# Patient Record
Sex: Female | Born: 1965 | Race: White | Hispanic: No | Marital: Married | Smoking: Current every day smoker
Health system: Southern US, Community
[De-identification: ages and names within clinical notes are randomized; demographics above are authoritative.]

## PROBLEM LIST (undated history)

## (undated) DIAGNOSIS — G8929 Other chronic pain: Secondary | ICD-10-CM

## (undated) DIAGNOSIS — D509 Iron deficiency anemia, unspecified: Secondary | ICD-10-CM

## (undated) DIAGNOSIS — Z8669 Personal history of other diseases of the nervous system and sense organs: Secondary | ICD-10-CM

## (undated) DIAGNOSIS — R51 Headache: Secondary | ICD-10-CM

## (undated) DIAGNOSIS — M2550 Pain in unspecified joint: Secondary | ICD-10-CM

## (undated) DIAGNOSIS — R519 Headache, unspecified: Secondary | ICD-10-CM

## (undated) DIAGNOSIS — K219 Gastro-esophageal reflux disease without esophagitis: Secondary | ICD-10-CM

## (undated) HISTORY — DX: Other chronic pain: G89.29

## (undated) HISTORY — DX: Headache, unspecified: R51.9

## (undated) HISTORY — DX: Headache: R51

## (undated) HISTORY — DX: Iron deficiency anemia, unspecified: D50.9

## (undated) HISTORY — PX: ABDOMINAL HYSTERECTOMY: SHX81

## (undated) HISTORY — DX: Personal history of other diseases of the nervous system and sense organs: Z86.69

## (undated) HISTORY — PX: TUBAL LIGATION: SHX77

## (undated) HISTORY — DX: Pain in unspecified joint: M25.50

---

## 2001-06-08 ENCOUNTER — Emergency Department (HOSPITAL_COMMUNITY): Admission: EM | Admit: 2001-06-08 | Discharge: 2001-06-08 | Payer: Self-pay | Admitting: *Deleted

## 2001-06-10 ENCOUNTER — Encounter: Payer: Self-pay | Admitting: *Deleted

## 2001-06-10 ENCOUNTER — Ambulatory Visit (HOSPITAL_COMMUNITY): Admission: RE | Admit: 2001-06-10 | Discharge: 2001-06-10 | Payer: Self-pay | Admitting: *Deleted

## 2001-07-06 ENCOUNTER — Encounter: Payer: Self-pay | Admitting: Emergency Medicine

## 2001-07-07 ENCOUNTER — Inpatient Hospital Stay (HOSPITAL_COMMUNITY): Admission: EM | Admit: 2001-07-07 | Discharge: 2001-07-10 | Payer: Self-pay | Admitting: Emergency Medicine

## 2001-08-06 ENCOUNTER — Ambulatory Visit (HOSPITAL_COMMUNITY): Admission: RE | Admit: 2001-08-06 | Discharge: 2001-08-06 | Payer: Self-pay | Admitting: Urology

## 2001-08-06 ENCOUNTER — Encounter: Payer: Self-pay | Admitting: Urology

## 2002-03-19 DIAGNOSIS — D509 Iron deficiency anemia, unspecified: Secondary | ICD-10-CM

## 2002-03-19 HISTORY — DX: Iron deficiency anemia, unspecified: D50.9

## 2002-11-09 ENCOUNTER — Ambulatory Visit (HOSPITAL_COMMUNITY): Admission: RE | Admit: 2002-11-09 | Discharge: 2002-11-09 | Payer: Self-pay | Admitting: Family Medicine

## 2002-11-09 ENCOUNTER — Encounter: Payer: Self-pay | Admitting: Family Medicine

## 2002-12-14 ENCOUNTER — Encounter: Payer: Self-pay | Admitting: Obstetrics and Gynecology

## 2002-12-14 ENCOUNTER — Ambulatory Visit (HOSPITAL_COMMUNITY): Admission: RE | Admit: 2002-12-14 | Discharge: 2002-12-14 | Payer: Self-pay | Admitting: Obstetrics and Gynecology

## 2003-05-17 ENCOUNTER — Inpatient Hospital Stay (HOSPITAL_COMMUNITY): Admission: RE | Admit: 2003-05-17 | Discharge: 2003-05-18 | Payer: Self-pay | Admitting: Obstetrics and Gynecology

## 2008-03-24 ENCOUNTER — Ambulatory Visit (HOSPITAL_COMMUNITY): Admission: RE | Admit: 2008-03-24 | Discharge: 2008-03-24 | Payer: Self-pay | Admitting: Family Medicine

## 2010-02-17 ENCOUNTER — Ambulatory Visit (HOSPITAL_COMMUNITY): Admission: RE | Admit: 2010-02-17 | Discharge: 2010-02-17 | Payer: Self-pay | Admitting: Family Medicine

## 2010-08-04 NOTE — Discharge Summary (Signed)
[facility]  Patient:    Suzanne Hart, Suzanne Hart Visit Number: [id number] MRN: [id number]          Service Type: MED Location: 3A G401 01 Attending Physician:  Alleen Borne I Dictated by:   Alleen Borne, M.D. Admit Date:  07/06/2001 Discharge Date: 07/10/2001                             Discharge Summary  HISTORY OF PRESENT ILLNESS:  This 45 year old female was riding her horse and, all of a sudden, horse jumped and the low stomach hit the saddle and started to have severe pain, brought into the emergency room where she was found to have gross hematuria and CT scan was done which showed there was fluid around the bladder.  A cystogram was performed and it showed extraperitoneal rupture of the bladder.  The gross bleeding through the Foley catheter almost subsided, so it was decided that patient should be admitted and observed on a bed rest.  HOSPITAL COURSE:  WBC count on admission was hemoglobin 11.6 and hematocrit 35.1.  Sodium 133, potassium 3.2, chloride 103, CO2 24, glucose 105, BUN 7, creatinine 0.7.  She was started on bed rest, IV fluids, and Foley catheter drainage.  Slowly, the gross bleeding disappeared.  The next day, her hematocrit was 32.1.  Still, her vital signs were stable, she was comfortable. Hematocrit on 4/22 was 30.6.  Her blood pressure was running low around 100 systolic, but she says that she runs low blood pressure.  She was completely asymptomatic otherwise.  She was still having some suprapubic discomfort on walking, urine grossly clear, and on 4/23, hematocrit was 29.8.  Her pulse was always stable at between 60 and 70.  So, that was the reason I did not give her any blood transfusion.  She was stable with this hematocrit.  On 4/24, her blood pressure was 111/80, pulse 70 per minute.  She was feeling much better. She was up and walking around, eating regular food.  So, I am going to discharge her home with a Foley catheter.   It should be mentioned also that she did not have any fracture of the pelvis.  So, at this point, she is being discharged home.  FINAL DISCHARGE DIAGNOSIS:  Extraperitoneal bladder rupture.  DISCHARGE CONDITION:  Improved.  DISCHARGE MEDICATIONS:  1. Cipro XR 250 mg one a day, #5.  2. Toradol 10 mg p.o. q.6h. p.r.n., #30.  3. Ferrous sulfate 325 mg one p.o. daily, #30.  FOLLOW-UP:  Report to the office in one week. Dictated by:   Alleen Borne, M.D. Attending Physician:  Alleen Borne I DD:  08/02/01 TD:  08/05/01 Job: [id number]

## 2010-08-04 NOTE — Procedures (Signed)
   NAME:  Suzanne Hart, Suzanne Hart                            ACCOUNT NO.:  [id number]   MEDICAL RECORD NO.:  [id number]                   PATIENT TYPE:  OUT   LOCATION:  RAD                                  FACILITY:  [facility]   PHYSICIAN:  Vida Roller, M.D.                DATE OF BIRTH:  1965/07/28   DATE OF PROCEDURE:  11/09/2002  DATE OF DISCHARGE:                                  ECHOCARDIOGRAM   TAPE NUMBER:  [id number].   TAPE COUNT:  [id number] - [id number].   INDICATIONS FOR PROCEDURE:  This is a 45 year old woman with chest pain and  peripheral edema.   TECHNICAL QUALITY:  Technical quality of the study was adequate.   M-MODE TRACINGS:  1. The aorta is 26 mm.  2. The left atrium is 42 mm.  3. The septum is 11 mm.  4. The posterior wall is 8 mm.  5. The left ventricular diastolic dimension is 43 mm.  6. The left ventricular systolic dimension is 31 mm.   2-D AND DOPPLER IMAGING:  1. The left ventricle is normal size with normal systolic and diastolic     function. There are no wall motion abnormalities seen.  2. The right ventricle is normal size with normal systolic function. No wall     motion abnormalities are seen.  3. Both atria are top normal size with the left atrium being just slightly     enlarged. There is no obvious atrial septal defect.  4. The aortic valve is tri-leaflet, tri-commissural, with no evidence of     stenosis or regurgitation.  5. The mitral valve is morphologically unremarkable with trivial     insufficiency. No stenosis is seen.  6. The tricuspid valve is morphologically unremarkable with no stenosis or     regurgitation.  7. The pulmonary valve is not well seen but appears to have trivial     insufficiency.  8. The pericardial structures are normal.  9. The inferior vena cava is normal size.  10.      The ascending aorta appears to be normal.                                               Vida Roller, M.D.    JH/MEDQ  D:  11/09/2002  T:  11/09/2002  Job:   [id number]

## 2010-08-04 NOTE — Op Note (Signed)
NAME:  Suzanne Hart, Suzanne Hart                            ACCOUNT NO.:  [id number]   MEDICAL RECORD NO.:  [id number]                   PATIENT TYPE:  INP   LOCATION:  A409                                 FACILITY:  [facility]   PHYSICIAN:  Tilda Burrow, M.D.              DATE OF BIRTH:  [birth year]   DATE OF PROCEDURE:  DATE OF DISCHARGE:                                 OPERATIVE REPORT   PREOPERATIVE DIAGNOSIS:  Uterine fibroids, pelvic pain secondary to  fibroids, enlargement of the uterus.   POSTOPERATIVE DIAGNOSES:  1. Uterine fibroids, pelvic pain secondary to fibroids, enlargement of the     uterus.  2. Fecalith of the appendix.   PROCEDURE:  1. Total abdominal hysterectomy.  2. Panniculectomy.  3. Incidental appendectomy.   SURGEON:  Tilda Burrow, M.D.   ASSISTANTMarlinda Mike, RN-FA   ANESTHESIA:  General.   COMPLICATIONS:  None.   FINDINGS:  1000 cc plus uterine size.  Protuberant abdomen due to underlying  uterine enlargement.  Evidence of prior, transverse, lower abdominal  incision.  Elongate anterior appendix with fecalith in its distal portion.   DETAILS OF PROCEDURE:  The patient was taken to the operating room and  prepped and draped for lower abdominal surgery with Foley catheter in place  and abdominal and vaginal prepping performed.  A transverse incision was  performed excising from the old suprapubic transverse incision, removing an  ellipse of skin 30 cm in length x 10 cm in maximum diameter; dissecting and  removing an approximately 3-cm thick flap of underlying fatty tissue being  careful to leave a layer of fatty tissue over the fascia which was not left  and denuded.  We then opened the abdomen in the midline between the rectus  muscles.  There were some adhesions encountered there from her prior  laparotomy.  The peritoneum was easily entered and no abdominal wall  adhesions encountered.  There was no suspicion of intra-abdominal trauma  related to entry.   The bowel was not visible, but due to the huge size of  the fibroid uterus the midline incision extended almost to the level of the  umbilicus, without placing a vertical component to the skin incision (Pelosi  technique).   The round ligaments could be identified by rotating the uterus away from the  involved side.  The round ligaments were then taken down in series;  clamping, cutting and suture ligating both fore-and-aft.  The utero-ovarian  ligament on the left side was easily isolated.  The broad ligament  penetrated and the utero-ovarian ligament doubly clamped, cut, and suture  ligated.  The other adnexa was treated similarly.  We then proceeded to  skeletonize the bladder flap anteriorly and dissect down the sides of the  uterus on either side. The huge uterus was quite mobile with minimal  adhesions.  We then were able to elevate the uterus through  the incision,  place Balfour retractor and bladder flap in place; and then proceed with  doubly cross-clamping the uterine vessels on the right side, placing a Kelly  clamp to control backbleeding, transecting the uterine vessels and then  doubly ligating the uterine vessels on that side.   The opposite site was treated similarly.  Straight Heaney clamp was then  used to clamp down the upper portions of the cardinal ligaments with  clamping, cutting, and suture ligating with #0 chromic.  At this point the  huge uterine fundus was amputated off the lower uterine segment and then  Lahey thyroid tenaculum was used to grasp the lower uterine segment stump.  We then marched down each side of the lower uterine segment clamping,  cutting, and suture ligating.  When we reached the level of the anterior  cervical vaginal fornix Bovie cautery was used to enter the anterior  cervical vaginal fornix without difficulty.  This was easily identified by  placing a right-angle clamp through the cervical os into the cervical  vaginal fornix for proper  orientation.   We then amputated the cervix off of the underlying cuff and then closed the  cuff with Aldrich stitches at each lateral vaginal angle, interrupted 2-0  chromic on each side x1 enclosing the remainder of the cuff in the midline.  Hemostasis was quite good.  The right tubo-ovarian complex was inspected and  pedicle required resuturing of the utero-ovarian ligament to improve  hemostasis.   We then inspected the appendix on this side and it was quite elongate, stuck  to the IP ligament on this side; and isolated, elevated, and palpated where  a large number of fecalith portions were noted in its most distal portions.  We decided to remove the appendix which was done by separating the  mesoappendix and the four pedicles, transecting the mesoappendix in series,  ligating each of the underlying pedicles and then cross-clamping the  appendix at the cecum, removing the appendix and doubly ligating the stump  with imbrication by the second ligation.   The pelvis was irrigated copiously; the bowel repositioned; the anterior  peritoneum closed with 2-0 chromic in running fashion.  The fascia closed  with #0 Maxon in a continuous running fashion with the subcu tissues  irrigated, confirmed as hemostatic, reapproximated using interrupted 2-0  plain with flat JP drain placed deep in the incision; and stable closure of  the skin used to complete the procedure.  The patient tolerated the  procedure well and went to recovery room in good condition with estimated  blood loss of 300 cc.      ___________________________________________                                            Tilda Burrow, M.D.   JVF/MEDQ  D:  05/17/2003  T:  05/18/2003  Job:  [id number]   cc:   Tilda Burrow, M.D.  [address] [place]  [place] [address]  Fax: [phone]

## 2010-08-04 NOTE — H&P (Signed)
[facility]  Patient:    Suzanne Hart, Suzanne Hart Visit Number: [id number] MRN: [id number]          Service Type: EMS Location: ED Attending Physician:  Annamarie Dawley Dictated by:   Alleen Borne, M.D. Admit Date:  07/06/2001                           History and Physical  CHIEF COMPLAINT:  Lower abdominal pain.  HISTORY OF PRESENT ILLNESS:  This is a 45 year old female who this afternoon was riding her horse and all of a sudden the horse started to buck and she hit her lower abdomen against the saddle and started to have severe pain, so she was brought by ambulance to the emergency room, where a CT scan of the abdomen was done.  It showed that she has fluid around her bladder, although no dye was seen in the fluid because the bladder was not distended.  Anyway, a cystogram was done and showed extraperitoneal rupture of the bladder and no other injury like pelvic fracture, etc.  The patient is having gross hematuria so we have inserted a Foley catheter after discussing with the patient and the husband.  I decided that she would be observed.  It is extraperitoneal, there is a chance it will heal over though open closure of the bladder.  It is possible she will continue to have more bleeding so I am going to check her hematocrit.  There may be another reason but if the bleeding does not stop I will have to go in and close the laceration.  At this point the patient is stable.  PAST MEDICAL HISTORY:  She does not have any other medical problem except fibroid uterus.  No history of diabetes, hypertension.  SOCIAL HISTORY:  She does smoke, and drinks occasionally.  REVIEW OF SYSTEMS:  Otherwise unremarkable.  PHYSICAL EXAMINATION:  GENERAL:  Fully conscious, alert and oriented.  VITAL SIGNS:  Blood pressure 120/80, pulse 66 per minute, temperature 98 degrees.  CENTRAL NERVOUS SYSTEM:  No gross neurologic deficit.  HEENT:  Negative.  NECK:  Supple.   No lymphadenopathy.  CHEST:  Symmetrical.  HEART:  Regular sinus rhythm.  ABDOMEN:  Soft, nondistended.  There is marked tenderness in the suprapubic area.  PELVIC:  Examination deferred.  Foley catheter is draining pinkish urine.  IMPRESSION:  Extraperitoneal rupture of the bladder.  PLAN:  1. IV fluid.  2. Observation.  3. Check hematocrit again in the morning. Dictated by:   Alleen Borne, M.D. Attending Physician:  Annamarie Dawley DD:  07/07/01 TD:  07/07/01 Job: [id number]
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
## 2012-09-22 ENCOUNTER — Ambulatory Visit (INDEPENDENT_AMBULATORY_CARE_PROVIDER_SITE_OTHER): Payer: BC Managed Care – PPO | Admitting: Family Medicine

## 2012-09-22 ENCOUNTER — Encounter: Payer: Self-pay | Admitting: Family Medicine

## 2012-09-22 VITALS — BP 118/74 | HR 70 | Ht 65.0 in | Wt 190.0 lb

## 2012-09-22 DIAGNOSIS — M549 Dorsalgia, unspecified: Secondary | ICD-10-CM

## 2012-09-22 MED ORDER — CHLORZOXAZONE 500 MG PO TABS: 500.0000 mg | ORAL_TABLET | Freq: Four times a day (QID) | ORAL | Status: DC | PRN

## 2012-09-22 MED ORDER — ETODOLAC 400 MG PO TABS: 400.0000 mg | ORAL_TABLET | Freq: Two times a day (BID) | ORAL | Status: DC

## 2012-09-22 NOTE — Patient Instructions (Signed)
Back Exercises Back exercises help treat and prevent back injuries. The goal of back exercises is to increase the strength of your abdominal and back muscles and the flexibility of your back. These exercises should be started when you no longer have back pain. Back exercises include:  Pelvic Tilt. Lie on your back with your knees bent. Tilt your pelvis until the lower part of your back is against the floor. Hold this position 5 to 10 sec and repeat 5 to 10 times.  Knee to Chest. Pull first 1 knee up against your chest and hold for 20 to 30 seconds, repeat this with the other knee, and then both knees. This may be done with the other leg straight or bent, whichever feels better.  Sit-Ups or Curl-Ups. Bend your knees 90 degrees. Start with tilting your pelvis, and do a partial, slow sit-up, lifting your trunk only 30 to 45 degrees off the floor. Take at least 2 to 3 seconds for each sit-up. Do not do sit-ups with your knees out straight. If partial sit-ups are difficult, simply do the above but with only tightening your abdominal muscles and holding it as directed.  Hip-Lift. Lie on your back with your knees flexed 90 degrees. Push down with your feet and shoulders as you raise your hips a couple inches off the floor; hold for 10 seconds, repeat 5 to 10 times.  Back arches. Lie on your stomach, propping yourself up on bent elbows. Slowly press on your hands, causing an arch in your low back. Repeat 3 to 5 times. Any initial stiffness and discomfort should lessen with repetition over time.  Shoulder-Lifts. Lie face down with arms beside your body. Keep hips and torso pressed to floor as you slowly lift your head and shoulders off the floor. Do not overdo your exercises, especially in the beginning. Exercises may cause you some mild back discomfort which lasts for a few minutes; however, if the pain is more severe, or lasts for more than 15 minutes, do not continue exercises until you see your caregiver.  Improvement with exercise therapy for back problems is slow.  See your caregivers for assistance with developing a proper back exercise program. Document Released: 04/12/2004 Document Revised: 05/28/2011 Document Reviewed: 01/04/2011 [employer] Patient Information 2014 [employer].  

## 2012-09-22 NOTE — Progress Notes (Signed)
  Subjective:    Patient ID: Suzanne Hart, female    DOB: [birth year], 47 y.o.   MRN: [id number]  Back Pain This is a new problem. The current episode started yesterday. The problem occurs constantly. The pain is at a severity of 8/10. The pain is severe. The pain is worse during the day. The symptoms are aggravated by bending and lying down. Associated symptoms include leg pain. Pertinent negatives include no headaches. Risk factors include lack of exercise. She has tried analgesics and NSAIDs for the symptoms. The treatment provided moderate relief.   Of note a sudden popping sensation when reaching out. Pain primarily left lumbar region. Some radiation across the back. Terry tight at times. No major radiation into left leg. No numbness or tingling.   Review of Systems  Musculoskeletal: Positive for back pain.  Neurological: Negative for headaches.   ROS otherwise negative     Objective:   Physical Exam Alert no acute distress. Lungs clear. Heart regular rate and rhythm. Positive left. Lumbar tenderness. Negative straight leg raise. Negative sciatic notch tenderness.       Assessment & Plan:  Impression lumbar strain discussed. Plan Lodine twice a day with food. Chlorzoxazone 3 times a day. Local measures discussed. Exercise after this columns down. WSL

## 2012-09-23 ENCOUNTER — Encounter: Payer: Self-pay | Admitting: *Deleted

## 2012-11-24 ENCOUNTER — Encounter: Payer: Self-pay | Admitting: Family Medicine

## 2012-11-24 ENCOUNTER — Ambulatory Visit (INDEPENDENT_AMBULATORY_CARE_PROVIDER_SITE_OTHER): Payer: BC Managed Care – PPO | Admitting: Family Medicine

## 2012-11-24 ENCOUNTER — Encounter: Payer: Self-pay | Admitting: Gastroenterology

## 2012-11-24 ENCOUNTER — Ambulatory Visit: Payer: BC Managed Care – PPO | Admitting: Family Medicine

## 2012-11-24 VITALS — BP 122/80 | Ht 65.0 in | Wt 188.8 lb

## 2012-11-24 DIAGNOSIS — Z780 Asymptomatic menopausal state: Secondary | ICD-10-CM | POA: Insufficient documentation

## 2012-11-24 DIAGNOSIS — K625 Hemorrhage of anus and rectum: Secondary | ICD-10-CM

## 2012-11-24 DIAGNOSIS — N951 Menopausal and female climacteric states: Secondary | ICD-10-CM

## 2012-11-24 DIAGNOSIS — R109 Unspecified abdominal pain: Secondary | ICD-10-CM

## 2012-11-24 LAB — CBC WITH DIFFERENTIAL/PLATELET
Basophils Absolute: 0 10*3/uL (ref 0.0–0.1)
Eosinophils Relative: 3 % (ref 0–5)
Lymphocytes Relative: 37 % (ref 12–46)
Lymphs Abs: 3.4 10*3/uL (ref 0.7–4.0)
Neutro Abs: 4.8 10*3/uL (ref 1.7–7.7)
Neutrophils Relative %: 53 % (ref 43–77)
Platelets: 293 10*3/uL (ref 150–400)
RBC: 4.6 MIL/uL (ref 3.87–5.11)
RDW: 14.4 % (ref 11.5–15.5)
WBC: 9.2 10*3/uL (ref 4.0–10.5)

## 2012-11-24 MED ORDER — CITALOPRAM HYDROBROMIDE 10 MG PO TABS: 10.0000 mg | ORAL_TABLET | Freq: Every day | ORAL | Status: DC

## 2012-11-24 NOTE — Progress Notes (Signed)
  Subjective:    Patient ID: Suzanne Hart, female    DOB: June 16, 1965, 47 y.o.   MRN: [id number]  HPI  Patient arrives and stated she started with diarrhea Sat. Am -lots of pressure -after several episodes of diarrhea she noticed a little blood in toilet and on paper when she wiped. Last time she noticed blood was 11am Sat morning. This patient relates that she noticed having some blood in her stool on 2 different occasions on early Saturday morning and mid Saturday morning she denies fever chills no recent travel no recent antibiotics. Has never had this problem before. No family history of Crohn's or ulcerative colitis.  The patient also relates severe menopausal symptoms over the past several months with intermittent irritability as well as hot flashes. She would like to try something. Past medical history family history social history were all reviewed Review of Systems She denies wheezing shortness of breath denies vomiting of blood denies black stools she also denies joint pains. She does relate some lower bowel discomfort on the left side    Objective:   Physical Exam Neck is supple no masses alert and oriented. Lungs are clear. Heart is regular. Abdomen is soft some subjective discomfort in the lower abdomen more so in the left lower quadrant strandy is no edema       Assessment & Plan:  #1 rectal bleeding-I believe this patient does need colonoscopy we will go ahead and refer for colonoscopy. Chance of cancer is low more than likely internal hemorrhoids. Lab work was ordered. If frequent diarrhea mucousy stools or worse followup #2 menopause symptoms/hot flashes-Celexa 10 mg daily I recommend recheck in 4 weeks see how this is doing may need to increase dose of medicine

## 2012-11-25 ENCOUNTER — Encounter: Payer: Self-pay | Admitting: Family Medicine

## 2012-11-25 LAB — HEPATIC FUNCTION PANEL
Alkaline Phosphatase: 91 U/L (ref 39–117)
Indirect Bilirubin: 0.1 mg/dL (ref 0.0–0.9)
Total Bilirubin: 0.2 mg/dL — ABNORMAL LOW (ref 0.3–1.2)
Total Protein: 6.8 g/dL (ref 6.0–8.3)

## 2012-11-27 ENCOUNTER — Encounter: Payer: Self-pay | Admitting: Family Medicine

## 2012-12-18 ENCOUNTER — Encounter: Payer: Self-pay | Admitting: Gastroenterology

## 2012-12-18 ENCOUNTER — Ambulatory Visit (INDEPENDENT_AMBULATORY_CARE_PROVIDER_SITE_OTHER): Payer: BC Managed Care – PPO | Admitting: Gastroenterology

## 2012-12-18 ENCOUNTER — Other Ambulatory Visit: Payer: Self-pay | Admitting: Gastroenterology

## 2012-12-18 VITALS — BP 110/70 | HR 72 | Temp 97.2°F | Ht 65.0 in | Wt 193.0 lb

## 2012-12-18 DIAGNOSIS — K625 Hemorrhage of anus and rectum: Secondary | ICD-10-CM

## 2012-12-18 DIAGNOSIS — R1013 Epigastric pain: Secondary | ICD-10-CM

## 2012-12-18 DIAGNOSIS — K3189 Other diseases of stomach and duodenum: Secondary | ICD-10-CM

## 2012-12-18 MED ORDER — PEG 3350-KCL-NA BICARB-NACL 420 G PO SOLR: 4000.0000 mL | ORAL | Status: DC

## 2012-12-18 MED ORDER — OMEPRAZOLE MAGNESIUM 20 MG PO TBEC: 20.0000 mg | DELAYED_RELEASE_TABLET | Freq: Every day | ORAL | Status: DC

## 2012-12-18 NOTE — Patient Instructions (Addendum)
We have provided a savings card for Prilosec so that you may get this free. You will take this each morning, 30 minutes before breakfast.   You have been scheduled for a colonoscopy with a possible upper endoscopy in the near future with Dr. Darrick Penna. Further recommendations to follow once completed.

## 2012-12-18 NOTE — Assessment & Plan Note (Signed)
Approximately one year of vague upper abdominal discomfort with certain foods; some mild reflux occurs several times a week. She is not on a PPI. Gallbladder remains in situ. Question gastritis, GERD, less likely PUD or biliary etiology. Will trial Prilosec each morning and plan for possible EGD at time of colonoscopy if no improvement by that point. May ultimately need [place] of abdomen if EGD negative.   Proceed with possible upper endoscopy at time of colonoscopy with Dr. Darrick Penna. The risks, benefits, and alternatives have been discussed in detail with patient. They have stated understanding and desire to proceed.  Prilosec samples provided

## 2012-12-18 NOTE — Assessment & Plan Note (Signed)
47 year old female with new-onset painless low-volume hematochezia in the setting of what appears to have been a short viral illness; she has had resolution of diarrhea and no further rectal bleeding. Likely benign anorectal source, but I recommend a lower GI evaluation via colonoscopy, which she has agreed to as well.  Proceed with colonoscopy with Dr. Darrick Penna in the near future. The risks, benefits, and alternatives have been discussed in detail with the patient. They state understanding and desire to proceed.

## 2012-12-18 NOTE — Progress Notes (Signed)
Referring Provider: Babs Sciara, MD Primary Care Physician:  Dr. Gerda Diss Primary Gastroenterologist:  Dr. Darrick Penna   Chief Complaint  Patient presents with  . Abdominal Pain  . Rectal Bleeding    HPI:   Suzanne Hart is a 47 year old female referred to our practice at the request of Dr. Lilyan Punt secondary to rectal bleeding. Notes recent onset of low-volume hematochezia during a time of what appears to have been a short viral illness. Noted loose stool, upset stomach. No further diarrhea since that time. No changes in bowel habits otherwise. No weight loss, lack of appetite. Notes upper abdominal burning after eating, usually triggered by eggs, broccoli. Symptoms of dyspepsia for the past year. Mild nausea after eating but no vomiting. Mild reflux, not severe. No PPI. Has Tagamet at home if she needs it, maybe 2 in the last month. No dysphagia. No melena. Rare NSAIDs, doesn't like to take medication very often. Upper abdominal discomfort 2-3 times per week.   Past Medical History  Diagnosis Date  . H/O migraine   . Chronic joint pain   . Chronic headache   . Iron deficiency anemia 2004    Secondary menorrhagia    Past Surgical History  Procedure Laterality Date  . Tubal ligation    . Abdominal hysterectomy      2008    No current outpatient prescriptions on file.   No current facility-administered medications for this visit.    Allergies as of 12/18/2012  . (No Known Allergies)    Family History  Problem Relation Age of Onset  . Mitral valve prolapse Paternal Aunt   . Congestive Heart Failure Paternal Grandfather     History   Social History  . Marital Status: Married    Spouse Name: N/A    Number of Children: N/A  . Years of Education: N/A   Occupational History  . Not on file.   Social History Main Topics  . Smoking status: Current Some Day Smoker  . Smokeless tobacco: Not on file  . Alcohol Use: Not on file  . Drug Use: Not on file  . Sexual  Activity: Not on file   Other Topics Concern  . Not on file   Social History Narrative  . No narrative on file    Review of Systems: Gen: Denies any fever, chills, loss of appetite, fatigue, weight loss. CV: occasional palpitations, was told she had a heart murmur Resp: Denies shortness of breath with rest, cough, wheezing GI: see HPI GU : feels like slightly more urinary frequency, PCP aware MS: Denies joint pain, muscle weakness, cramps, limited movement Derm: Denies rash, itching, dry skin Psych: Denies depression, anxiety, confusion or memory loss  Heme: Denies bruising, bleeding, and enlarged lymph nodes.  Physical Exam: BP 110/70  Pulse 72  Temp(Src) 97.2 F (36.2 C) (Oral)  Ht 5\' 5"  (1.651 m)  Wt 193 lb (87.544 kg)  BMI 32.12 kg/m2 General:   Alert and oriented. Well-developed, well-nourished, pleasant and cooperative. Head:  Normocephalic and atraumatic. Eyes:  Conjunctiva pink, sclera clear, no icterus.   Conjunctiva pink. Ears:  Normal auditory acuity. Nose:  No deformity, discharge,  or lesions. Mouth:  No deformity or lesions, mucosa pink and moist.  Neck:  Supple, without mass or thyromegaly. Lungs:  Clear to auscultation bilaterally, without wheezing, rales, or rhonchi.  Heart:  S1, S2 present without murmurs noted.  Abdomen:  +BS, soft, mild discomfort on palpation of epigastric region and non-distended. Without mass or  HSM. No rebound or guarding. No hernias noted. Rectal:  Deferred  Msk:  Symmetrical without gross deformities. Normal posture. Extremities:  Without clubbing or edema. Neurologic:  Alert and  oriented x4;  grossly normal neurologically. Skin:  Intact, warm and dry without significant lesions or rashes Cervical Nodes:  No significant cervical adenopathy. Psych:  Alert and cooperative. Normal mood and affect.

## 2012-12-18 NOTE — Progress Notes (Signed)
CC'd to PCP 

## 2013-01-01 ENCOUNTER — Encounter (HOSPITAL_COMMUNITY): Payer: Self-pay | Admitting: Pharmacy Technician

## 2013-01-16 ENCOUNTER — Encounter (HOSPITAL_COMMUNITY)
Admission: RE | Disposition: A | Discharge status: Home / Self Care | Payer: Self-pay | Source: Ambulatory Visit | Attending: Gastroenterology

## 2013-01-16 ENCOUNTER — Ambulatory Visit (HOSPITAL_COMMUNITY)
Admission: RE | Admit: 2013-01-16 | Discharge: 2013-01-16 | Disposition: A | Discharge status: Home / Self Care | Payer: BC Managed Care – PPO | Source: Ambulatory Visit | Attending: Gastroenterology | Admitting: Gastroenterology

## 2013-01-16 ENCOUNTER — Encounter (HOSPITAL_COMMUNITY): Payer: Self-pay | Admitting: *Deleted

## 2013-01-16 DIAGNOSIS — R1013 Epigastric pain: Secondary | ICD-10-CM

## 2013-01-16 DIAGNOSIS — K625 Hemorrhage of anus and rectum: Secondary | ICD-10-CM

## 2013-01-16 DIAGNOSIS — K3189 Other diseases of stomach and duodenum: Secondary | ICD-10-CM | POA: Insufficient documentation

## 2013-01-16 DIAGNOSIS — K2940 Chronic atrophic gastritis without bleeding: Secondary | ICD-10-CM | POA: Insufficient documentation

## 2013-01-16 DIAGNOSIS — D126 Benign neoplasm of colon, unspecified: Secondary | ICD-10-CM

## 2013-01-16 DIAGNOSIS — K449 Diaphragmatic hernia without obstruction or gangrene: Secondary | ICD-10-CM | POA: Insufficient documentation

## 2013-01-16 DIAGNOSIS — K648 Other hemorrhoids: Secondary | ICD-10-CM | POA: Insufficient documentation

## 2013-01-16 DIAGNOSIS — K2980 Duodenitis without bleeding: Secondary | ICD-10-CM

## 2013-01-16 DIAGNOSIS — K2990 Gastroduodenitis, unspecified, without bleeding: Secondary | ICD-10-CM

## 2013-01-16 DIAGNOSIS — K2970 Gastritis, unspecified, without bleeding: Secondary | ICD-10-CM

## 2013-01-16 DIAGNOSIS — K620 Anal polyp: Secondary | ICD-10-CM

## 2013-01-16 DIAGNOSIS — K621 Rectal polyp: Secondary | ICD-10-CM

## 2013-01-16 DIAGNOSIS — D128 Benign neoplasm of rectum: Secondary | ICD-10-CM | POA: Insufficient documentation

## 2013-01-16 HISTORY — DX: Gastro-esophageal reflux disease without esophagitis: K21.9

## 2013-01-16 HISTORY — PX: ESOPHAGOGASTRODUODENOSCOPY: SHX5428

## 2013-01-16 HISTORY — PX: COLONOSCOPY: SHX5424

## 2013-01-16 SURGERY — COLONOSCOPY
Anesthesia: Moderate Sedation

## 2013-01-16 MED ORDER — MIDAZOLAM HCL 5 MG/5ML IJ SOLN
INTRAMUSCULAR | Status: AC
  Filled 2013-01-16: qty 10

## 2013-01-16 MED ORDER — OMEPRAZOLE MAGNESIUM 20 MG PO TBEC: 20.0000 mg | DELAYED_RELEASE_TABLET | Freq: Every day | ORAL | Status: DC

## 2013-01-16 MED ORDER — STERILE WATER FOR IRRIGATION IR SOLN
Status: DC | PRN
  Administered 2013-01-16: 11:00:00

## 2013-01-16 MED ORDER — PROMETHAZINE HCL 25 MG/ML IJ SOLN
INTRAMUSCULAR | Status: AC
  Filled 2013-01-16: qty 1

## 2013-01-16 MED ORDER — OMEPRAZOLE MAGNESIUM 20 MG PO TBEC: DELAYED_RELEASE_TABLET | ORAL | Status: DC

## 2013-01-16 MED ORDER — SODIUM CHLORIDE 0.9 % IV SOLN
INTRAVENOUS | Status: DC
  Administered 2013-01-16: 1000 mL via INTRAVENOUS

## 2013-01-16 MED ORDER — MEPERIDINE HCL 100 MG/ML IJ SOLN
INTRAMUSCULAR | Status: AC
  Filled 2013-01-16: qty 2

## 2013-01-16 MED ORDER — MEPERIDINE HCL 100 MG/ML IJ SOLN
INTRAMUSCULAR | Status: DC | PRN
  Administered 2013-01-16 (×5): 25 mg via INTRAVENOUS

## 2013-01-16 MED ORDER — MIDAZOLAM HCL 5 MG/5ML IJ SOLN
INTRAMUSCULAR | Status: DC | PRN
  Administered 2013-01-16: 1 mg via INTRAVENOUS
  Administered 2013-01-16 (×4): 2 mg via INTRAVENOUS

## 2013-01-16 NOTE — H&P (Signed)
  Primary Care Physician:  Lilyan Punt, MD Primary Gastroenterologist:  Dr. Darrick Penna  Pre-Procedure History & Physical: HPI:  Suzanne Hart is a 47 y.o. female here for BRBPR/DYSPEPSIA.  Past Medical History  Diagnosis Date  . H/O migraine   . Chronic joint pain   . Chronic headache   . Iron deficiency anemia 2004    Secondary menorrhagia  . GERD (gastroesophageal reflux disease)    Past Surgical History  Procedure Laterality Date  . Tubal ligation    . Abdominal hysterectomy      2008   Prior to Admission medications   Medication Sig Start Date End Date Taking? Authorizing Provider  omeprazole (PRILOSEC OTC) 20 MG tablet Take 1 tablet (20 mg total) by mouth daily. 12/18/12  Yes Nira Retort, NP  polyethylene glycol-electrolytes (TRILYTE) 420 G solution Take 4,000 mLs by mouth as directed. 12/18/12  Yes West Bali, MD   Allergies as of 12/18/2012  . (No Known Allergies)    Family History  Problem Relation Age of Onset  . Mitral valve prolapse Paternal Aunt   . Congestive Heart Failure Paternal Grandfather   . Colon cancer Maternal Grandmother   . COPD Mother     History   Social History  . Marital Status: Married    Spouse Name: N/A    Number of Children: N/A  . Years of Education: N/A   Occupational History  . [employer]   Social History Main Topics  . Smoking status: Current Some Day Smoker -- 15.00 packs/day for .5 years  . Smokeless tobacco: Not on file  . Alcohol Use: No  . Drug Use: No  . Sexual Activity: Not on file   Other Topics Concern  . Not on file   Social History Narrative  . No narrative on file    Review of Systems: See HPI, otherwise negative ROS   Physical Exam: BP 126/71  Pulse 72  Temp(Src) 98 F (36.7 C) (Oral)  Resp 23  Ht 5' 4.5" (1.638 m)  Wt 190 lb (86.183 kg)  BMI 32.12 kg/m2  SpO2 98% General:   Alert,  pleasant and cooperative in NAD Head:  Normocephalic and  atraumatic. Neck:  Supple; Lungs:  Clear throughout to auscultation.    Heart:  Regular rate and rhythm. Abdomen:  Soft, nontender and nondistended. Normal bowel sounds, without guarding, and without rebound.   Neurologic:  Alert and  oriented x4;  grossly normal neurologically.  Impression/Plan:    BRBPR/DYSPEPSIA  PLAN: EGD/TCS TODAY

## 2013-01-16 NOTE — Op Note (Signed)
[facility] [address] [place], [address]   COLONOSCOPY PROCEDURE REPORT  PATIENT: Suzanne Hart, Suzanne Hart  MR#: [id number] BIRTHDATE: 1965-10-06 , 47  yrs. old GENDER: Female ENDOSCOPIST: Jonette Eva, MD REFERRED XB:JYNWG Gerda Diss, M.D. PROCEDURE DATE:  01/16/2013 PROCEDURE:   Colonoscopy with cold biopsy polypectomy INDICATIONS:Rectal Bleeding: 1-2 X/MO. MEDICATIONS: Demerol 100 mg IV and Versed 8 mg IV  DESCRIPTION OF PROCEDURE:    Physical exam was performed.  Informed consent was obtained from the patient after explaining the benefits, risks, and alternatives to procedure.  The patient was connected to monitor and placed in left lateral position. Continuous oxygen was provided by nasal cannula and IV medicine administered through an indwelling cannula.  After administration of sedation and rectal exam, the patients rectum was intubated and the EC-[id number] ([id number])  colonoscope was advanced under direct visualization to the ileum.  The scope was removed slowly by carefully examining the color, texture, anatomy, and integrity mucosa on the way out.  The patient was recovered in endoscopy and discharged home in satisfactory condition.    COLON FINDINGS: The mucosa appeared normal in the terminal ileum.  , Five sessile polyps measuring 3-4 mm in size were found in the sigmoid colon(1) and rectum.  A polypectomy was performed with cold forceps.  , and Moderate sized internal hemorrhoids were found.  PREP QUALITY: good.    CECAL W/D TIME: 12 minutes     COMPLICATIONS: None  ENDOSCOPIC IMPRESSION: 1.   Normal mucosa in the terminal ileum 2.   Five COLON POLYPS REMOVED 3.   RECTAL BLEEDING DUE TO Moderate sized internal hemorrhoids   RECOMMENDATIONS: CONTINUE OMEPRAZOLE.  TAKE 30 MINUTES PRIOR TO YOUR MEALS TWICE DAILY. AVOID ITEMS THAT TRIGGER GASTRITIS. FOLLOW A LOW FAT/HIGH FIBER DIET.  AVOID ITEMS THAT cause bloating. BIOPSY WILL BE BACK IN 7 DAYS.  FOLLOW  UP IN 3 MOS.  NEXT COLONOSCOPY IN 10 YEARS       _______________________________ eSignedJonette Eva, MD 01/16/2013 5:09 PM

## 2013-01-16 NOTE — Op Note (Addendum)
[facility] [address] [place], [address]   ENDOSCOPY PROCEDURE REPORT  PATIENT: Suzanne Hart, Suzanne Hart  MR#: [id number] BIRTHDATE: 28-Sep-1965 , 47  yrs. old GENDER: Female  ENDOSCOPIST: Jonette Eva, MD REFERRED HY:QMVHQ Gerda Diss, M.D.  PROCEDURE DATE: 01/16/2013 PROCEDURE:   EGD w/ biopsy  INDICATIONS:Dyspepsia. MEDICATIONS: TCS + Demerol 25 mg IV and Versed 1mg  IV TOPICAL ANESTHETIC:   Cetacaine Spray  DESCRIPTION OF PROCEDURE:     Physical exam was performed.  Informed consent was obtained from the patient after explaining the benefits, risks, and alternatives to the procedure.  The patient was connected to the monitor and placed in the left lateral position.  Continuous oxygen was provided by nasal cannula and IV medicine administered through an indwelling cannula.  After administration of sedation, the patients esophagus was intubated and the [id number] ([id number])  endoscope was advanced under direct visualization to the second portion of the duodenum.  The scope was removed slowly by carefully examining the color, texture, anatomy, and integrity of the mucosa on the way out.  The patient was recovered in endoscopy and discharged home in satisfactory condition.   ESOPHAGUS: The mucosa of the esophagus appeared normal.   A small hiatal hernia was noted.   STOMACH: Mild non-erosive gastritis (inflammation) was found in the gastric antrum.  Multiple biopsies were performed using cold forceps.   DUODENUM: Mild duodenal inflammation was found in the duodenal bulb.   The duodenal mucosa showed no abnormalities in the 2nd part of the duodenum. COMPLICATIONS:   None  ENDOSCOPIC IMPRESSION: 1.   DYSPEPSIA DUE TO MILD GASTRITIS/DUODENITIS AND POSSIBLY GERD 2.   Small hiatal hernia  RECOMMENDATIONS: CONTINUE OMEPRAZOLE.  TAKE 30 MINUTES PRIOR TO YOUR MEALS TWICE DAILY. AVOID ITEMS THAT TRIGGER GASTRITIS. FOLLOW A LOW FAT/HIGH FIBER DIET.  AVOID ITEMS THAT cause  bloating. BIOPSY WILL BE BACK IN 7 DAYS.  FOLLOW UP IN 3 MOS. CONSIDER ABDOMINAL U/S.  NEXT COLONOSCOPY IN 10 YEARS.   REPEAT EXAM:   _______________________________ Rosalie DoctorJonette Eva, MD 01/16/2013 5:13 PM Revised: 01/16/2013 5:13 PM      PATIENT NAME:  [name] MR#: [id number]

## 2013-01-22 ENCOUNTER — Other Ambulatory Visit: Payer: Self-pay

## 2013-01-26 ENCOUNTER — Telehealth: Payer: Self-pay | Admitting: Gastroenterology

## 2013-01-26 NOTE — Telephone Encounter (Signed)
LMOM for a return call.  

## 2013-01-26 NOTE — Telephone Encounter (Signed)
Please call pt. HER stomach Bx shows gastritis.  She had HYPERPLASTIC POLYPS removed.   CONTINUE OMEPRAZOLE.  TAKE 30 MINUTES PRIOR TO YOUR MEALS TWICE DAILY.  AVOID ITEMS THAT TRIGGER GASTRITIS.   FOLLOW A LOW FAT/HIGH FIBER DIET. AVOID ITEMS THAT CAUSE BLOATING.   YOUR BIOPSY WILL BE BACK IN 7 DAYS.  FOLLOW UP IN 3 MOS E30 ABD PAIN AS.  NEXT COLONOSCOPY IN 10 YEARS

## 2013-01-26 NOTE — Telephone Encounter (Signed)
Pt returned call and was informed.  

## 2013-01-27 NOTE — Telephone Encounter (Signed)
Reminder in epic °

## 2013-03-02 ENCOUNTER — Encounter: Payer: Self-pay | Admitting: Family Medicine

## 2013-03-02 ENCOUNTER — Ambulatory Visit (INDEPENDENT_AMBULATORY_CARE_PROVIDER_SITE_OTHER): Payer: BC Managed Care – PPO | Admitting: Family Medicine

## 2013-03-02 VITALS — BP 120/80 | Temp 98.3°F | Ht 65.0 in | Wt 192.1 lb

## 2013-03-02 DIAGNOSIS — J329 Chronic sinusitis, unspecified: Secondary | ICD-10-CM

## 2013-03-02 MED ORDER — AMOXICILLIN-POT CLAVULANATE 875-125 MG PO TABS: 1.0000 | ORAL_TABLET | Freq: Two times a day (BID) | ORAL | Status: AC

## 2013-03-02 NOTE — Progress Notes (Signed)
   Subjective:    Patient ID: Suzanne Hart, female    DOB: [birth year], 47 y.o.   MRN: [id number]  Sinusitis This is a new problem. The current episode started more than 1 month ago. The problem is unchanged. There has been no fever. Her pain is at a severity of 9/10. The pain is moderate. Associated symptoms include chills, headaches and sinus pressure. Treatments tried: benadryl. The treatment provided no relief.    Headache frontal  Right frontal, no cough  Chills and achey.diminished energy, definitely sinus and not migraine  Very diff than the migr, uses plain saline at night  Review of Systems  Constitutional: Positive for chills.  HENT: Positive for sinus pressure.   Neurological: Positive for headaches.   No vomiting no diarrhea no rash ROS otherwise negative    Objective:   Physical Exam Alert mild malaise. Frontal tenderness mass or tenderness pharynx erythematous vital stable afebrile. Lungs clear heart regular in rhythm.       Assessment & Plan:  Impression 1 rhinosinusitis plan antibiotics prescribed. Symptomatic care discussed. Augmentin twice a day 10 days. Warning signs discussed. WSL

## 2013-06-10 ENCOUNTER — Encounter: Payer: Self-pay | Admitting: Gastroenterology

## 2013-06-10 NOTE — Progress Notes (Signed)
E30 ABD PAIN AS OR SLF.  NEXT COLONOSCOPY IN 10 YEARS

## 2013-06-11 ENCOUNTER — Encounter: Payer: Self-pay | Admitting: Gastroenterology

## 2013-06-11 NOTE — Progress Notes (Signed)
Reminder in epic and OV made for 4/23 at 9 with AS and appt card mailed

## 2013-06-30 ENCOUNTER — Ambulatory Visit (INDEPENDENT_AMBULATORY_CARE_PROVIDER_SITE_OTHER): Payer: BC Managed Care – PPO | Admitting: Family Medicine

## 2013-06-30 ENCOUNTER — Encounter: Payer: Self-pay | Admitting: Family Medicine

## 2013-06-30 VITALS — BP 112/76 | Temp 98.4°F | Ht 65.0 in | Wt 194.0 lb

## 2013-06-30 DIAGNOSIS — J329 Chronic sinusitis, unspecified: Secondary | ICD-10-CM

## 2013-06-30 MED ORDER — CEFDINIR 300 MG PO CAPS: 300.0000 mg | ORAL_CAPSULE | Freq: Two times a day (BID) | ORAL | Status: DC

## 2013-06-30 NOTE — Progress Notes (Signed)
   Subjective:    Patient ID: Suzanne Hart, female    DOB: 1965-08-11, 48 y.o.   MRN: [id number]  Cough This is a new problem. The current episode started in the past 7 days. Associated symptoms include ear pain and headaches. Associated symptoms comments: Runny nose, fever. Treatments tried: ibuprofen.   Sinus headache now and had a viral syndrome  Had a crick int he ne k early on Pos prod gunknky  ibu for the ha, aleave prn,  Pos fever and chills felt bad and achey.     Review of Systems  HENT: Positive for ear pain.   Respiratory: Positive for cough.   Neurological: Positive for headaches.       Objective:   Physical Exam  Alert mild malaise. Flomax or tenderness. Pharynx normal neck supple. Lungs clear. Heart regular in rhythm.      Assessment & Plan:  Impression post viral rhinosinusitis plan antibiotics prescribed. Since Medicare discussed. Smoking cessation discussed. WSL

## 2013-07-09 ENCOUNTER — Ambulatory Visit (INDEPENDENT_AMBULATORY_CARE_PROVIDER_SITE_OTHER): Payer: BC Managed Care – PPO | Admitting: Gastroenterology

## 2013-07-09 ENCOUNTER — Encounter: Payer: Self-pay | Admitting: Gastroenterology

## 2013-07-09 VITALS — BP 133/74 | HR 61 | Temp 97.6°F | Ht 65.0 in | Wt 194.4 lb

## 2013-07-09 DIAGNOSIS — K3189 Other diseases of stomach and duodenum: Secondary | ICD-10-CM

## 2013-07-09 DIAGNOSIS — R1013 Epigastric pain: Secondary | ICD-10-CM

## 2013-07-09 DIAGNOSIS — K625 Hemorrhage of anus and rectum: Secondary | ICD-10-CM

## 2013-07-09 NOTE — Progress Notes (Signed)
    Referring Provider: Kathyrn Drown, MD Primary Care Physician:  Sallee Lange, MD Primary GI: Dr. Oneida Alar   Chief Complaint  Patient presents with  . Follow-up    HPI:   Suzanne Hart returns in follow-up with history of rectal bleeding, dyspepsia. Colonoscopy and EGD performed. Internal hemorrhoids culprit of rectal bleeding. Gastritis/duodenitis without H.pylori.  No abdominal pain or rectal bleeding. Every once in awhile will have a wave of nausea but mild. Tries to eat something with fiber daily, which helps significantly. Not taking Prilosec. Would rather not take.    Past Medical History  Diagnosis Date  . H/O migraine   . Chronic joint pain   . Chronic headache   . Iron deficiency anemia 2004    Secondary menorrhagia  . GERD (gastroesophageal reflux disease)     Past Surgical History  Procedure Laterality Date  . Tubal ligation    . Abdominal hysterectomy      2008  . Colonoscopy N/A 01/16/2013    Dr. Fields:Normal mucosa in the terminal ileum/Five COLON POLYPS REMOVED/RECTAL BLEEDING DUE TO Moderate sized internal hemorrhoids. hyperplastic polyps  . Esophagogastroduodenoscopy N/A 01/16/2013    Dr. Gillie Manners DUE TO MILD GASTRITIS/DUODENITIS AND POSSIBLY GERD/small [facility]    Current Outpatient Prescriptions  Medication Sig Dispense Refill  . cefdinir (OMNICEF) 300 MG capsule Take 1 capsule (300 mg total) by mouth 2 (two) times daily.  20 capsule  0   No current facility-administered medications for this visit.    Allergies as of 07/09/2013  . (No Known Allergies)    Family History  Problem Relation Age of Onset  . Mitral valve prolapse Paternal Aunt   . Congestive Heart Failure Paternal Grandfather   . Colon cancer Maternal Grandmother   . COPD Mother     History   Social History  . Marital Status: Married    Spouse Name: N/A    Number of Children: N/A  . Years of Education: N/A   Occupational History  . [employer]   Social History Main Topics  . Smoking status: Current Some Day Smoker -- 15.00 packs/day for .5 years  . Smokeless tobacco: None  . Alcohol Use: No  . Drug Use: No  . Sexual Activity: None   Other Topics Concern  . None   Social History Narrative  . None    Review of Systems: As mentioned in HPI.   Physical Exam: BP 133/74  Pulse 61  Temp(Src) 97.6 F (36.4 C) (Oral)  Ht 5\' 5"  (1.651 m)  Wt 194 lb 6.4 oz (88.179 kg)  BMI 32.35 kg/m2 General:   Alert and oriented. No distress noted. Pleasant and cooperative.  Head:  Normocephalic and atraumatic. Heart:  S1, S2 present without murmurs, rubs, or gallops. Regular rate and rhythm. Abdomen:  +BS, soft, non-tender and non-distended. No rebound or guarding. No HSM or masses noted. Msk:  Symmetrical without gross deformities. Normal posture. Extremities:  Without edema. Neurologic:  Alert and  oriented x4;  grossly normal neurologically. Skin:  Intact without significant lesions or rashes. Psych:  Alert and cooperative. Normal mood and affect.

## 2013-07-09 NOTE — Patient Instructions (Signed)
Please review the handout attached.   If you have recurrent rectal bleeding, call our office. You may need a hemorrhoid banding done in the office.   Next colonoscopy in 10 years. We will see you back as needed!  Gastritis, Adult Gastritis is soreness and swelling (inflammation) of the lining of the stomach. Gastritis can develop as a sudden onset (acute) or long-term (chronic) condition. If gastritis is not treated, it can lead to stomach bleeding and ulcers. CAUSES  Gastritis occurs when the stomach lining is weak or damaged. Digestive juices from the stomach then inflame the weakened stomach lining. The stomach lining may be weak or damaged due to viral or bacterial infections. One common bacterial infection is the Helicobacter pylori infection. Gastritis can also result from excessive alcohol consumption, taking certain medicines, or having too much acid in the stomach.  SYMPTOMS  In some cases, there are no symptoms. When symptoms are present, they may include:  Pain or a burning sensation in the upper abdomen.  Nausea.  Vomiting.  An uncomfortable feeling of fullness after eating. DIAGNOSIS  Your caregiver may suspect you have gastritis based on your symptoms and a physical exam. To determine the cause of your gastritis, your caregiver may perform the following:  Blood or stool tests to check for the H pylori bacterium.  Gastroscopy. A thin, flexible tube (endoscope) is passed down the esophagus and into the stomach. The endoscope has a light and camera on the end. Your caregiver uses the endoscope to view the inside of the stomach.  Taking a tissue sample (biopsy) from the stomach to examine under a microscope. TREATMENT  Depending on the cause of your gastritis, medicines may be prescribed. If you have a bacterial infection, such as an H pylori infection, antibiotics may be given. If your gastritis is caused by too much acid in the stomach, H2 blockers or antacids may be given.  Your caregiver may recommend that you stop taking aspirin, ibuprofen, or other nonsteroidal anti-inflammatory drugs (NSAIDs). HOME CARE INSTRUCTIONS  Only take over-the-counter or prescription medicines as directed by your caregiver.  If you were given antibiotic medicines, take them as directed. Finish them even if you start to feel better.  Drink enough fluids to keep your urine clear or pale yellow.  Avoid foods and drinks that make your symptoms worse, such as:  Caffeine or alcoholic drinks.  Chocolate.  Peppermint or mint flavorings.  Garlic and onions.  Spicy foods.  Citrus fruits, such as oranges, lemons, or limes.  Tomato-based foods such as sauce, chili, salsa, and pizza.  Fried and fatty foods.  Eat small, frequent meals instead of large meals. SEEK IMMEDIATE MEDICAL CARE IF:   You have black or dark red stools.  You vomit blood or material that looks like coffee grounds.  You are unable to keep fluids down.  Your abdominal pain gets worse.  You have a fever.  You do not feel better after 1 week.  You have any other questions or concerns. MAKE SURE YOU:  Understand these instructions.  Will watch your condition.  Will get help right away if you are not doing well or get worse. Document Released: 02/27/2001 Document Revised: 09/04/2011 Document Reviewed: 04/18/2011 [facility] Patient Information 2014 [place].

## 2013-07-09 NOTE — Assessment & Plan Note (Signed)
Secondary to gastritis. Improvement with dietary choices; actually not taking a PPI currently and would like to abstain. Discussed once daily dosing if recurrent symptoms. Gastritis handout provided. Return prn.

## 2013-07-09 NOTE — Progress Notes (Signed)
cc'd to pcp 

## 2013-07-09 NOTE — Assessment & Plan Note (Signed)
Resolved. Secondary to internal hemorrhoids. Next colonoscopy in 2024. Consider outpatient banding if recurs. Return prn.

## 2013-11-12 ENCOUNTER — Ambulatory Visit (INDEPENDENT_AMBULATORY_CARE_PROVIDER_SITE_OTHER): Payer: BC Managed Care – PPO | Admitting: Family Medicine

## 2013-11-12 ENCOUNTER — Encounter: Payer: Self-pay | Admitting: Family Medicine

## 2013-11-12 VITALS — BP 118/80 | Temp 98.2°F | Ht 65.0 in | Wt 193.0 lb

## 2013-11-12 DIAGNOSIS — J45909 Unspecified asthma, uncomplicated: Secondary | ICD-10-CM

## 2013-11-12 DIAGNOSIS — J329 Chronic sinusitis, unspecified: Secondary | ICD-10-CM

## 2013-11-12 DIAGNOSIS — J4520 Mild intermittent asthma, uncomplicated: Secondary | ICD-10-CM

## 2013-11-12 MED ORDER — CEFDINIR 300 MG PO CAPS: 300.0000 mg | ORAL_CAPSULE | Freq: Two times a day (BID) | ORAL | Status: DC

## 2013-11-12 MED ORDER — ALBUTEROL SULFATE HFA 108 (90 BASE) MCG/ACT IN AERS: 2.0000 | INHALATION_SPRAY | Freq: Four times a day (QID) | RESPIRATORY_TRACT | Status: DC | PRN

## 2013-11-12 NOTE — Progress Notes (Signed)
   Subjective:    Patient ID: Suzanne Hart, female    DOB: Aug 12, 1965, 48 y.o.   MRN: [id number]  Cough This is a new problem. The current episode started in the past 7 days. Associated symptoms include headaches and wheezing. Treatments tried: cough syrup, benadryl.   Frontal headache  Cong in the chest   No fever  Bad tightness in chest  No chronic smokeer's cough no allergy hx  Notes a sense of wheeziness and the chest and tightness.   Review of Systems  Respiratory: Positive for cough and wheezing.   Neurological: Positive for headaches.   No vomiting no diarrhea ROS otherwise negative    Objective:   Physical Exam  Alert mild malaise. HEENT frontal maxillary tenderness. Pharynx normal neck supple. Lungs bilateral minimal wheezes heart regular in rhythm.      Assessment & Plan:  Impression acute rhinosinusitis/bronchitis with element of reactive airways plan encouraged to stop smoking. Antibiotics prescribed. Ventolin when necessary for wheezing. WSL

## 2013-11-16 ENCOUNTER — Telehealth: Payer: Self-pay | Admitting: Family Medicine

## 2013-11-16 MED ORDER — AMOXICILLIN-POT CLAVULANATE 875-125 MG PO TABS: 1.0000 | ORAL_TABLET | Freq: Two times a day (BID) | ORAL | Status: DC

## 2013-11-16 NOTE — Telephone Encounter (Signed)
Nurse plz spk with pt to clarify ongoing symptoms, can stop omnicef and switch to aug [id number] bid ten d

## 2013-11-16 NOTE — Telephone Encounter (Signed)
Patient said that her symptoms from 11/12/2013 are still present and not getting better at all. I asked her what symptoms, but she just said the exact same symptoms that she was seen for last week. Please advise.   [place]

## 2013-11-16 NOTE — Telephone Encounter (Signed)
Patient states she is still having headache, congestion and cough-currently on Omnicef. Patient advised to stop Omnicef and start Augmentin. Patient verbalized understanding and med sent electronically to [place].

## 2013-12-02 ENCOUNTER — Ambulatory Visit: Payer: BC Managed Care – PPO | Admitting: Family Medicine

## 2013-12-02 ENCOUNTER — Encounter: Payer: Self-pay | Admitting: Family Medicine

## 2013-12-03 ENCOUNTER — Ambulatory Visit (INDEPENDENT_AMBULATORY_CARE_PROVIDER_SITE_OTHER): Payer: BC Managed Care – PPO | Admitting: Family Medicine

## 2013-12-03 ENCOUNTER — Encounter: Payer: Self-pay | Admitting: Family Medicine

## 2013-12-03 VITALS — BP 112/80 | Temp 98.4°F | Ht 65.0 in | Wt 194.5 lb

## 2013-12-03 DIAGNOSIS — T63441A Toxic effect of venom of bees, accidental (unintentional), initial encounter: Secondary | ICD-10-CM

## 2013-12-03 DIAGNOSIS — T63461A Toxic effect of venom of wasps, accidental (unintentional), initial encounter: Secondary | ICD-10-CM

## 2013-12-03 DIAGNOSIS — T6391XA Toxic effect of contact with unspecified venomous animal, accidental (unintentional), initial encounter: Secondary | ICD-10-CM

## 2013-12-03 NOTE — Progress Notes (Signed)
   Subjective:    Patient ID: [name], female    DOB: November 09, 1965, 48 y.o.   MRN: [id number]  HPI Patient is here today because she was stung by a bee on Saturday. She was stung on her left forearm. The area is still red, swollen, itching and burning. Patient is concerned that it may be trying to get infected. Teatments tried: hydrocortisone, benadryl. Minimal relief noted.  Patient states she has no other concerns at this time.      Review of Systems Itching burning no soreness or pain    Objective:   Physical Exam  Bee sting noted on the left arm with some redness around it no sign of cellulitis      Assessment & Plan:  Bee sting now allergic reaction and need for EpiPen home treatment recommended no antibiotics warning signs discussed for future bee stings

## 2013-12-03 NOTE — Patient Instructions (Signed)

## 2014-05-20 ENCOUNTER — Ambulatory Visit (INDEPENDENT_AMBULATORY_CARE_PROVIDER_SITE_OTHER): Payer: BC Managed Care – PPO | Admitting: Nurse Practitioner

## 2014-05-20 ENCOUNTER — Encounter: Payer: Self-pay | Admitting: Nurse Practitioner

## 2014-05-20 ENCOUNTER — Encounter: Payer: Self-pay | Admitting: Family Medicine

## 2014-05-20 VITALS — BP 112/84 | Temp 97.7°F | Ht 65.0 in | Wt 199.0 lb

## 2014-05-20 DIAGNOSIS — K219 Gastro-esophageal reflux disease without esophagitis: Secondary | ICD-10-CM | POA: Diagnosis not present

## 2014-05-20 DIAGNOSIS — J300 Vasomotor rhinitis: Secondary | ICD-10-CM

## 2014-05-20 DIAGNOSIS — H8392 Unspecified disease of left inner ear: Secondary | ICD-10-CM | POA: Diagnosis not present

## 2014-05-20 MED ORDER — OMEPRAZOLE 40 MG PO CPDR: 40.0000 mg | DELAYED_RELEASE_CAPSULE | Freq: Every day | ORAL | Status: DC

## 2014-05-20 NOTE — Patient Instructions (Addendum)
Meclizine OTC as directed nasacort AQ as directed Antihistamine as directed  Food Choices for Gastroesophageal Reflux Disease When you have gastroesophageal reflux disease (GERD), the foods you eat and your eating habits are very important. Choosing the right foods can help ease the discomfort of GERD. WHAT GENERAL GUIDELINES DO I NEED TO FOLLOW?  Choose fruits, vegetables, whole grains, low-fat dairy products, and low-fat meat, fish, and poultry.  Limit fats such as oils, salad dressings, butter, nuts, and avocado.  Keep a food diary to identify foods that cause symptoms.  Avoid foods that cause reflux. These may be different for different people.  Eat frequent small meals instead of three large meals each day.  Eat your meals slowly, in a relaxed setting.  Limit fried foods.  Cook foods using methods other than frying.  Avoid drinking alcohol.  Avoid drinking large amounts of liquids with your meals.  Avoid bending over or lying down until 2-3 hours after eating. WHAT FOODS ARE NOT RECOMMENDED? The following are some foods and drinks that may worsen your symptoms: Vegetables Tomatoes. Tomato juice. Tomato and spaghetti sauce. Chili peppers. Onion and garlic. Horseradish. Fruits Oranges, grapefruit, and lemon (fruit and juice). Meats High-fat meats, fish, and poultry. This includes hot dogs, ribs, ham, sausage, salami, and bacon. Dairy Whole milk and chocolate milk. Sour cream. Cream. Butter. Ice cream. Cream cheese.  Beverages Coffee and tea, with or without caffeine. Carbonated beverages or energy drinks. Condiments Hot sauce. Barbecue sauce.  Sweets/Desserts Chocolate and cocoa. Donuts. Peppermint and spearmint. Fats and Oils High-fat foods, including [place] fries and potato chips. Other Vinegar. Strong spices, such as black pepper, white pepper, red pepper, cayenne, curry powder, cloves, ginger, and chili powder. The items listed above may not be a complete list  of foods and beverages to avoid. Contact your dietitian for more information. Document Released: 03/05/2005 Document Revised: 03/10/2013 Document Reviewed: 01/07/2013 [facility] Patient Information 2015 [place], [place]. This information is not intended to replace advice given to you by your health care provider. Make sure you discuss any questions you have with your health care provider.

## 2014-05-23 ENCOUNTER — Encounter: Payer: Self-pay | Admitting: Nurse Practitioner

## 2014-05-23 NOTE — Progress Notes (Signed)
Subjective:  Presents for c/o sporadic mild lightheadedness x 2 d. Noticed with certain position changes such as turning her head. Left ear discomfort. Slight headache 4 d ago. No fever. No weakness or numbness of face, arms or legs. No difficulty swallowing. Also mild mid upper abd pain. Had heartburn about 3 am, nausea no vomiting. Took TUMS. Smokes 1/4 ppd. Some weight gain. Large amt of caffeine intake. No alcohol or NSAIDs. Bowels nl.   Objective:   BP 112/84 mmHg  Temp(Src) 97.7 F (36.5 C) (Oral)  Ht 5\' 5"  (1.651 m)  Wt 199 lb (90.266 kg)  BMI 33.12 kg/m2 NAD. Alert, oriented. Right TM nl. Left TM: very retracted, color nl. Pharynx: clear. Neck supple with mild anterior adenopathy. Lungs clear. Heart RRR. Abdomen soft, non distended, with mild epigastric area tenderness.   Assessment: Vasomotor rhinitis  Inner ear dysfunction, left  Gastroesophageal reflux disease without esophagitis  Plan:  Meds ordered this encounter  Medications  . omeprazole (PRILOSEC) 40 MG capsule    Sig: Take 1 capsule (40 mg total) by mouth daily. At bedtime    Dispense:  30 capsule    Refill:  2    Order Specific Question:  Supervising Provider    Answer:  Mikey Kirschner [[id number]]   Slowly decrease caffeine intake. Discussed smoking cessation.  Call back if worsens or persists. OTC meclizine as directed if dizziness worsens.

## 2014-07-06 ENCOUNTER — Encounter: Payer: Self-pay | Admitting: Family Medicine

## 2014-07-06 ENCOUNTER — Ambulatory Visit (INDEPENDENT_AMBULATORY_CARE_PROVIDER_SITE_OTHER): Payer: BC Managed Care – PPO | Admitting: Family Medicine

## 2014-07-06 VITALS — BP 120/86 | Temp 98.0°F | Ht 65.0 in | Wt 203.0 lb

## 2014-07-06 DIAGNOSIS — J329 Chronic sinusitis, unspecified: Secondary | ICD-10-CM | POA: Diagnosis not present

## 2014-07-06 DIAGNOSIS — H6502 Acute serous otitis media, left ear: Secondary | ICD-10-CM | POA: Diagnosis not present

## 2014-07-06 MED ORDER — AMOXICILLIN-POT CLAVULANATE 875-125 MG PO TABS: 1.0000 | ORAL_TABLET | Freq: Two times a day (BID) | ORAL | Status: AC

## 2014-07-06 NOTE — Progress Notes (Signed)
   Subjective:    Patient ID: Suzanne Hart, female    DOB: May 18, 1965, 49 y.o.   MRN: [id number]  Otalgia  There is pain in the left ear. This is a new problem. The current episode started 1 to 4 weeks ago. The problem occurs constantly. The problem has been unchanged. There has been no fever. The pain is moderate. Associated symptoms include headaches. She has tried acetaminophen and NSAIDs (Sudafed, salin spray) for the symptoms. The treatment provided no relief.   Patient states she has no other concerns at this time.   Has tried nasocort and when goes to sleep  Right frontal headache  Popping sensation in the left legj  Frontal headache   Review of Systems  HENT: Positive for ear pain.   Neurological: Positive for headaches.   no vomiting no diarrhea     Objective:   Physical Exam  Alert hydration good HET moderate his congestion frontal tenderness pharynx normal left TM mild effusion lungs clear heart regular in rhythm      Assessment & Plan:  Impression rhinosinusitis/left otitis plan antibiotics prescribed. Symptom care discussed warning signs discussed WSL

## 2014-08-23 ENCOUNTER — Ambulatory Visit (INDEPENDENT_AMBULATORY_CARE_PROVIDER_SITE_OTHER): Payer: BC Managed Care – PPO | Admitting: Family Medicine

## 2014-08-23 ENCOUNTER — Encounter: Payer: Self-pay | Admitting: Family Medicine

## 2014-08-23 VITALS — BP 130/80 | Temp 97.9°F | Ht 65.0 in | Wt 199.0 lb

## 2014-08-23 DIAGNOSIS — W57XXXA Bitten or stung by nonvenomous insect and other nonvenomous arthropods, initial encounter: Secondary | ICD-10-CM | POA: Diagnosis not present

## 2014-08-23 DIAGNOSIS — T148 Other injury of unspecified body region: Secondary | ICD-10-CM

## 2014-08-23 MED ORDER — DOXYCYCLINE HYCLATE 100 MG PO CAPS: 100.0000 mg | ORAL_CAPSULE | Freq: Two times a day (BID) | ORAL | Status: DC

## 2014-08-23 NOTE — Patient Instructions (Signed)
Tick Bite Information Ticks are insects that attach themselves to the skin and draw blood for food. There are various types of ticks. Common types include wood ticks and deer ticks. Most ticks live in shrubs and grassy areas. Ticks can climb onto your body when you make contact with leaves or grass where the tick is waiting. The most common places on the body for ticks to attach themselves are the scalp, neck, armpits, waist, and groin. Most tick bites are harmless, but sometimes ticks carry germs that cause diseases. These germs can be spread to a person during the tick's feeding process. The chance of a disease spreading through a tick bite depends on:   The type of tick.  Time of year.   How long the tick is attached.   Geographic location.  HOW CAN YOU PREVENT TICK BITES? Take these steps to help prevent tick bites when you are outdoors:  Wear protective clothing. Long sleeves and long pants are best.   Wear white clothes so you can see ticks more easily.  Tuck your pant legs into your socks.   If walking on a trail, stay in the middle of the trail to avoid brushing against bushes.  Avoid walking through areas with long grass.  Put insect repellent on all exposed skin and along boot tops, pant legs, and sleeve cuffs.   Check clothing, hair, and skin repeatedly and before going inside.   Brush off any ticks that are not attached.  Take a shower or bath as soon as possible after being outdoors.  WHAT IS THE PROPER WAY TO REMOVE A TICK? Ticks should be removed as soon as possible to help prevent diseases caused by tick bites. 1. If latex gloves are available, put them on before trying to remove a tick.  2. Using fine-point tweezers, grasp the tick as close to the skin as possible. You may also use curved forceps or a tick removal tool. Grasp the tick as close to its head as possible. Avoid grasping the tick on its body. 3. Pull gently with steady upward pressure until  the tick lets go. Do not twist the tick or jerk it suddenly. This may break off the tick's head or mouth parts. 4. Do not squeeze or crush the tick's body. This could force disease-carrying fluids from the tick into your body.  5. After the tick is removed, wash the bite area and your hands with soap and water or other disinfectant such as alcohol. 6. Apply a small amount of antiseptic cream or ointment to the bite site.  7. Wash and disinfect any instruments that were used.  Do not try to remove a tick by applying a hot match, petroleum jelly, or fingernail polish to the tick. These methods do not work and may increase the chances of disease being spread from the tick bite.  WHEN SHOULD YOU SEEK MEDICAL CARE? Contact your health care provider if you are unable to remove a tick from your skin or if a part of the tick breaks off and is stuck in the skin.  After a tick bite, you need to be aware of signs and symptoms that could be related to diseases spread by ticks. Contact your health care provider if you develop any of the following in the days or weeks after the tick bite:  Unexplained fever.  Rash. A circular rash that appears days or weeks after the tick bite may indicate the possibility of Lyme disease. The rash may resemble   a target with a bull's-eye and may occur at a different part of your body than the tick bite.  Redness and swelling in the area of the tick bite.   Tender, swollen lymph glands.   Diarrhea.   Weight loss.   Cough.   Fatigue.   Muscle, joint, or bone pain.   Abdominal pain.   Headache.   Lethargy or a change in your level of consciousness.  Difficulty walking or moving your legs.   Numbness in the legs.   Paralysis.  Shortness of breath.   Confusion.   Repeated vomiting.  Document Released: 03/02/2000 Document Revised: 12/24/2012 Document Reviewed: 08/13/2012 [employer] Patient Information 2015 [employer]. This information is  not intended to replace advice given to you by your health care provider. Make sure you discuss any questions you have with your health care provider.  

## 2014-08-23 NOTE — Progress Notes (Signed)
   Subjective:    Patient ID: Suzanne Hart, female    DOB: [birth year], 49 y.o.   MRN: [id number]  HPI Patient is here today to have a tick removed from her abdomen. Tick bite occurred on Saturday. Redness noted. Treatments tried: Tweezers and alcohol with no relief.   Patient has no other concerns at this time.    Review of Systems She denies headaches muscle aches sweats chills fever    Objective:   Physical Exam  On examination she has excoriated area with erythema around on the lower right abdomen along with several scabs and dark areas patient concerned about the possibility of retained tick parts  The area was cleansed with alcohol wipe then a #15 blade was used to scrape the area affective he in order to remove any foreign debris. Band-Aid placed.      Assessment & Plan:  Localized erythema with tick bite d[place] can twice a day 7 days. Take with snack and water Recommend follow-up if progressive troubles

## 2014-10-17 NOTE — Progress Notes (Signed)
REVIEWED-NO ADDITIONAL RECOMMENDATIONS. 

## 2014-11-25 ENCOUNTER — Ambulatory Visit (INDEPENDENT_AMBULATORY_CARE_PROVIDER_SITE_OTHER): Payer: BC Managed Care – PPO | Admitting: Nurse Practitioner

## 2014-11-25 ENCOUNTER — Encounter: Payer: Self-pay | Admitting: Nurse Practitioner

## 2014-11-25 VITALS — BP 110/78 | Temp 97.9°F | Ht 65.0 in | Wt 203.0 lb

## 2014-11-25 DIAGNOSIS — L0291 Cutaneous abscess, unspecified: Secondary | ICD-10-CM

## 2014-11-25 DIAGNOSIS — L0390 Cellulitis, unspecified: Secondary | ICD-10-CM | POA: Diagnosis not present

## 2014-11-25 MED ORDER — SULFAMETHOXAZOLE-TRIMETHOPRIM 800-160 MG PO TABS: 1.0000 | ORAL_TABLET | Freq: Two times a day (BID) | ORAL | Status: DC

## 2014-11-25 NOTE — Progress Notes (Signed)
Subjective:  Presents for complaints of an abscess under her right breast that began about a week ago. Over the past couple of days has developed 2 more new areas. No further tenderness. No fever. Also has a similar area in the right groin.  Objective:   BP 110/78 mmHg  Temp(Src) 97.9 F (36.6 C) (Oral)  Ht 5\' 5"  (1.651 m)  Wt 203 lb (92.08 kg)  BMI 33.78 kg/m2 NAD. Alert, oriented. 3 slightly raised pink purplish fluctuant areas noted on the upper abdomen beneath the right breast. Superficial. Nontender. Consistent with a small superficial abscesses. No indication for I&D. A smaller area noted in the right intertriginous area.  Assessment: Cellulitis and abscess  Plan:  Meds ordered this encounter  Medications  . sulfamethoxazole-trimethoprim (BACTRIM DS,SEPTRA DS) 800-160 MG per tablet    Sig: Take 1 tablet by mouth 2 (two) times daily.    Dispense:  20 tablet    Refill:  1    Order Specific Question:  Supervising Provider    Answer:  Mikey Kirschner [[id number]]   Warning signs reviewed. Call back in 4-5 days if no improvement, sooner if worse. Also explained this is possible MRSA infection.

## 2015-02-04 ENCOUNTER — Telehealth: Payer: Self-pay | Admitting: Family Medicine

## 2015-02-04 ENCOUNTER — Encounter: Payer: Self-pay | Admitting: Family Medicine

## 2015-02-04 NOTE — Telephone Encounter (Signed)
ERROR

## 2015-02-04 NOTE — Telephone Encounter (Signed)
Please see Note on [facility]

## 2015-02-04 NOTE — Telephone Encounter (Signed)
Pt called wanting either Dr. Nicki Reaper or Hoyle Sauer to call her. When I asked what it was regarding the pt got angry and said that it was personal and that she was not going to tell me. I explained to the pt that I needed something in order to send back the note. Pt then insisted that I give her an appt for Monday. I then told the pt that if I made her an appt that I would need to know what the appt was for. She got angrier and hung up on me.

## 2015-02-06 NOTE — Telephone Encounter (Signed)
Pt spoken with

## 2015-04-20 ENCOUNTER — Ambulatory Visit (INDEPENDENT_AMBULATORY_CARE_PROVIDER_SITE_OTHER): Payer: BC Managed Care – PPO | Admitting: Nurse Practitioner

## 2015-04-20 ENCOUNTER — Encounter: Payer: Self-pay | Admitting: Nurse Practitioner

## 2015-04-20 VITALS — BP 120/86 | Temp 98.3°F | Ht 65.0 in | Wt 198.4 lb

## 2015-04-20 DIAGNOSIS — R300 Dysuria: Secondary | ICD-10-CM | POA: Diagnosis not present

## 2015-04-20 LAB — POCT URINALYSIS DIPSTICK
SPEC GRAV UA: 1.015
pH, UA: 6

## 2015-04-20 MED ORDER — SULFAMETHOXAZOLE-TRIMETHOPRIM 800-160 MG PO TABS: 1.0000 | ORAL_TABLET | Freq: Two times a day (BID) | ORAL | Status: DC

## 2015-04-21 ENCOUNTER — Encounter: Payer: Self-pay | Admitting: Nurse Practitioner

## 2015-04-21 LAB — POCT UA - MICROSCOPIC ONLY
BACTERIA, U MICROSCOPIC: 0
Casts, Ur, LPF, POC: 0
Crystals, Ur, HPF, POC: 0
Epithelial cells, urine per micros: 0
MUCUS UA: 0
RBC, URINE, MICROSCOPIC: 0
WBC, Ur, HPF, POC: 0
Yeast, UA: 0

## 2015-04-21 NOTE — Progress Notes (Signed)
Subjective:   Presents for complaints of urinary frequency and pressure that began yesterday afternoon. No fever. Slight headache today.  No nausea or vomiting. No diarrhea. Minimal constipation. No vaginal discharge. No pelvic pain. Has a history of slight incontinence. Married, same sexual partner. Sees Dr. Glo Herring for her physicals. Has had a history of TAH. No history of recent UTI. No cough runny nose sore throat or ear pain.   Objective:   BP 120/86 mmHg  Temp(Src) 98.3 F (36.8 C) (Oral)  Ht 5\' 5"  (1.651 m)  Wt 198 lb 6.4 oz (89.994 kg)  BMI 33.02 kg/m2  NAD. Alert, oriented. Lungs clear. No CVA or flank tenderness. Heart regular rate rhythm. Abdomen soft nondistended nontender. UA and urine microscopic negative.  Assessment: Dysuria - Plan: POCT UA - Microscopic Only, POCT urinalysis dipstick, Urine culture  Plan:  Meds ordered this encounter  Medications  . sulfamethoxazole-trimethoprim (BACTRIM DS,SEPTRA DS) 800-160 MG tablet    Sig: Take 1 tablet by mouth 2 (two) times daily.    Dispense:  20 tablet    Refill:  1    Order Specific Question:  Supervising Provider    Answer:  Mikey Kirschner [[id number]]    start antibiotics. Urine culture pending. Warning signs reviewed. Call back in the meantime if symptoms worsen.

## 2015-04-22 LAB — PLEASE NOTE

## 2015-04-22 LAB — URINE CULTURE

## 2015-06-16 ENCOUNTER — Encounter: Payer: Self-pay | Admitting: Family Medicine

## 2015-06-16 ENCOUNTER — Ambulatory Visit (INDEPENDENT_AMBULATORY_CARE_PROVIDER_SITE_OTHER): Payer: BC Managed Care – PPO | Admitting: Family Medicine

## 2015-06-16 VITALS — BP 110/76 | Temp 98.3°F | Ht 65.0 in | Wt 188.4 lb

## 2015-06-16 DIAGNOSIS — J329 Chronic sinusitis, unspecified: Secondary | ICD-10-CM | POA: Diagnosis not present

## 2015-06-16 DIAGNOSIS — J111 Influenza due to unidentified influenza virus with other respiratory manifestations: Secondary | ICD-10-CM

## 2015-06-16 MED ORDER — AMOXICILLIN-POT CLAVULANATE 875-125 MG PO TABS: ORAL_TABLET | ORAL | Status: DC

## 2015-06-16 MED ORDER — HYDROCODONE-HOMATROPINE 5-1.5 MG/5ML PO SYRP: ORAL_SOLUTION | ORAL | Status: DC

## 2015-06-16 NOTE — Progress Notes (Signed)
   Subjective:    Patient ID: Suzanne Hart, female    DOB: Aug 28, 1965, 50 y.o.   MRN: [id number]  Influenza This is a new problem. The current episode started in the past 7 days. Associated symptoms include chest pain, chills, coughing, fatigue, a fever, headaches and myalgias. She has tried acetaminophen and NSAIDs (Tylenol, Iburpofen, Nyquil) for the symptoms.   Chills achey feeling   Bad diminshed energy  Cough has veen rough   Back and head  Cough Prod , productive at times Energy level poor Appetite poor    Patient states no other concerns this visit.  Review of Systems  Constitutional: Positive for fever, chills and fatigue.  Respiratory: Positive for cough.   Cardiovascular: Positive for chest pain.  Musculoskeletal: Positive for myalgias.  Neurological: Positive for headaches.       Objective:   Physical Exam  Alert moderate malaise hydration good vitals stable HEENT moderate nasal congestion frontal tenderness pharynx normal neck supple lungs bronchial cough no wheezes or crackles heart regular rate and rhythm.      Assessment & Plan:  Impression post flu rhinosinusitis/bronchitis plan antibiotics prescribed. Symptom care discussed warning signs discussed WSL

## 2015-07-11 ENCOUNTER — Ambulatory Visit (INDEPENDENT_AMBULATORY_CARE_PROVIDER_SITE_OTHER): Payer: BC Managed Care – PPO | Admitting: Family Medicine

## 2015-07-11 ENCOUNTER — Encounter: Payer: Self-pay | Admitting: Family Medicine

## 2015-07-11 VITALS — BP 110/74 | Temp 98.6°F | Ht 65.0 in | Wt 191.0 lb

## 2015-07-11 DIAGNOSIS — H8110 Benign paroxysmal vertigo, unspecified ear: Secondary | ICD-10-CM | POA: Diagnosis not present

## 2015-07-11 MED ORDER — MECLIZINE HCL 25 MG PO TABS: 25.0000 mg | ORAL_TABLET | Freq: Three times a day (TID) | ORAL | Status: DC | PRN

## 2015-07-11 NOTE — Progress Notes (Signed)
   Subjective:    Patient ID: Suzanne Hart, female    DOB: 1966/03/16, 50 y.o.   MRN: [id number]  Dizziness This is a new problem. Episode onset: 3 days ago. Associated symptoms comments: Room spinning, fatigue, sleepy, headache. Treatments tried: otc motion sicknees med. The treatment provided mild relief.   Patient states over the weekend had several times were she felt like her head was spinning and she felt nauseous in the room is spinning it got better it somewhat better today denies any unilateral numbness or weakness his never had this before   Review of Systems  Neurological: Positive for dizziness.   See above. Denies any unilateral numbness weakness.    Objective:   Physical Exam  No nystagmus is noted. EOMI. Finger nose normal Romberg negative. No sinus stroke.      Assessment & Plan:  Inner ear dysfunction vertigo I showed her how to do Epley maneuver. In addition to this we'll use Antivert when necessary. If ongoing troubles may need referral to ENT greater in half the time was spent discussing the diagnosis and treatment thereof

## 2015-10-11 ENCOUNTER — Encounter: Payer: Self-pay | Admitting: Family Medicine

## 2015-10-11 ENCOUNTER — Ambulatory Visit (INDEPENDENT_AMBULATORY_CARE_PROVIDER_SITE_OTHER): Payer: BC Managed Care – PPO | Admitting: Family Medicine

## 2015-10-11 VITALS — BP 110/76 | Temp 98.7°F | Ht 65.0 in | Wt 193.0 lb

## 2015-10-11 DIAGNOSIS — M79605 Pain in left leg: Secondary | ICD-10-CM

## 2015-10-11 NOTE — Progress Notes (Signed)
   Subjective:    Patient ID: Suzanne Hart, female    DOB: 29-Apr-1965, 50 y.o.   MRN: [id number]  Knee Pain   Incident onset: one week ago. There was no injury mechanism. Pain location: pain behind left knee. She has tried NSAIDs for the symptoms. The treatment provided moderate relief.   This been gone on for the past couple weeks. Try anti-inflammatory with some help.   Review of Systems Patient denies fever chills sweats denies any shortness of breath. She does relate some soreness behind left knee    Objective:   Physical Exam Subjective discomfort left knee some fullness but no mass felt no tenderness in the Her 5. Knee ligaments are normal. I believe that more than likely this is a possibility of a Baker cyst  Patient does have some crepitus in both knees     Assessment & Plan:  Leg discomfort behind the left knee we will do ultrasound to look for possibility Baker cyst versus DVT I doubt a DVT.  Significant anxiety and possible depression symptoms questionnaires given patient will fill these out she states is been going on for several months might benefit from a low-dose SSRI  Patient is going through menopause currently

## 2015-10-12 ENCOUNTER — Telehealth: Payer: Self-pay | Admitting: Family Medicine

## 2015-10-12 NOTE — Telephone Encounter (Signed)
Pt dropped off paper work for that was given to her by the doc at her appt this week   See yellow folder

## 2015-10-13 ENCOUNTER — Telehealth: Payer: Self-pay | Admitting: Family Medicine

## 2015-10-13 NOTE — Telephone Encounter (Signed)
1) Pt called to say that she can't afford to do the [place] that you wanted her to get. They want too much money up front for the procedure.   2) she would also like to know via the  Questionnaire are you going to prescribe an anti depressant?

## 2015-10-14 ENCOUNTER — Ambulatory Visit (HOSPITAL_COMMUNITY): Admission: RE | Admit: 2015-10-14 | Payer: BC Managed Care – PPO | Source: Ambulatory Visit

## 2015-10-14 ENCOUNTER — Other Ambulatory Visit: Payer: Self-pay | Admitting: *Deleted

## 2015-10-14 MED ORDER — SERTRALINE HCL 50 MG PO TABS: 50.0000 mg | ORAL_TABLET | Freq: Every day | ORAL | 1 refills | Status: DC

## 2015-10-14 NOTE — Telephone Encounter (Signed)
Discussed with pt. #1 in regards to the ultrasound if she cannot afford to get it done that is fine if she starts noticing swelling in the back of her calf or pain or tenderness in the back of her calf or thigh then she needs to reconsider doing this. #2 as for the questionnaires it does show significant anxiety and depression symptoms I would recommend starting a SSRI. This typically is the most effective. I would start off with Zoloft 50 mg-one daily-the patient should understand the first week she should do one half daily in order to adjusted the medication then go up to 1 tablet daily. The patient should follow-up with [place] in approximately 4 weeks to recheck to make sure that she is getting along with the medicine and that it is starting to help. Typically it can take up to 8 weeks to see the full effect we may need to go up on the dose of the medicine if this does not doing. if the patient starts feeling severely depressed suicidal or other symptoms while on the medicine she must immediately be seen here or ER the chances of that happening is very low. Pt verbalized understanding. Pt did want to start med. Med sent to pharm. Pt states she will call back to schedule follow up in 4 weeks.

## 2015-10-14 NOTE — Telephone Encounter (Signed)
#  1 in regards to the ultrasound if she cannot afford to get it done that is fine if she starts noticing swelling in the back of her calf or pain or tenderness in the back of her calf or thigh then she needs to reconsider doing this. #2 as for the questionnaires it does show significant anxiety and depression symptoms I would recommend starting a SSRI. This typically is the most effective. I would start off with Zoloft 50 mg-one daily-the patient should understand the first week she should do one half daily in order to adjusted the medication then go up to 1 tablet daily. The patient should follow-up with [place] in approximately 4 weeks to recheck to make sure that she is getting along with the medicine and that it is starting to help. Typically it can take up to 8 weeks to see the full effect we may need to go up on the dose of the medicine if this does not doing. Obviously if the patient starts feeling severely depressed suicidal or other symptoms while on the medicine she must immediately be seen here or ER the chances of that happening is very low (I tried to call the patient I got her answering machine but there was no ability to leave a message) once you discuss this with the patient if she agrees she may get 30 tablets with one refill with follow-up office visit in one month

## 2015-12-05 ENCOUNTER — Telehealth: Payer: Self-pay | Admitting: Family Medicine

## 2015-12-05 NOTE — Telephone Encounter (Signed)
If any blood or fever then needs to be seen-please tell the patient -There is no way to know for certain. The approach that we would recommend would be the following. #1 stop the medication #2 the diarrhea should check up over the next 5 days if it is possibly related area #3 restart the medication half tablet daily for 7 days then 1 tablet daily if diarrhea reoccurs then it is due to the medicine. If the diarrhea stays away he was more likely due to a 5

## 2015-12-05 NOTE — Telephone Encounter (Signed)
Patient was started on Zoloft on 10/14/15

## 2015-12-05 NOTE — Telephone Encounter (Signed)
Pt called during lunch and left a message regarding this. Please advise.

## 2015-12-05 NOTE — Telephone Encounter (Signed)
Pt called stating that she has had diarrhea for the past 7 days and is wondering if it could be coming from the new medication that Dr. Nicki Reaper has her on. Pt has been on that med for 4 weeks now. Please advise.

## 2015-12-05 NOTE — Telephone Encounter (Signed)
Spoke with patient and informed her per Dr.Scott Luking- If any blood or fever then needs to be seen-please tell the patient -There is no way to know for certain. The approach that we would recommend would be the following. #1 stop the medication #2 the diarrhea should check up over the next 5 days if it is possibly related area #3 restart the medication half tablet daily for 7 days then 1 tablet daily if diarrhea reoccurs then it is due to the medicine. If the diarrhea stays away he was more likely due to a virus. Patient verbalized understanding.

## 2015-12-07 ENCOUNTER — Telehealth: Payer: BC Managed Care – PPO | Admitting: Family

## 2015-12-07 ENCOUNTER — Encounter: Payer: Self-pay | Admitting: Family Medicine

## 2015-12-07 NOTE — Telephone Encounter (Signed)
I agree that you definitely need to be seen. I will alert are front desk. Please call them on Thursday. Please reference this message. They should be able to fit you in on Thursday. I am out of the office on Thursday-except for administrative work-but I will be back in the office on Friday. Please make sure you call on Thursday thank you

## 2015-12-07 NOTE — Progress Notes (Signed)
Based on what you shared with me it looks like you have a serious condition that should be evaluated in a face to face office visit.  If you are having a true medical emergency please call 911.  If you need an urgent face to face visit, [place] has four urgent care centers for your convenience.  If you need care fast and have a high deductible or no insurance consider:   [email or web address]  [phone]  [address] [place], [place] [address] 8 am to 8 pm Monday-Friday 10 am to 4 pm Saturday-Sunday   The following sites will take your  insurance:    . [facility]gent [place] a Provider at this Location  [address] [place], [place] [address] . 10 am to 8 pm Monday-Friday . 12 pm to 8 pm Saturday-Sunday   . [facility] at [place] a Provider at this Location  [address], [place], [place] [address] . 8 am to 8 pm Monday-Friday . 9 am to 6 pm Saturday . 11 am to 6 pm Sunday   . [facility]gent Care at [place] Get Driving Directions  [id number] [place], [place] [address] . 8 am to 8 pm Monday-Friday . 8 am to 4 pm Saturday-Sunday   . [facility] (a walk in primary care provider)  [place] a Provider at this Location  [place], [place] [address] . 8 am to 8:30 pm Monday-Thursday . 8 am to 6 pm Friday . 8 am to 4 pm Saturday-Sunday   Your e-visit answers were reviewed by a board certified advanced clinical practitioner to complete your personal care plan.  Thank you for using e-Visits.

## 2015-12-08 ENCOUNTER — Ambulatory Visit (INDEPENDENT_AMBULATORY_CARE_PROVIDER_SITE_OTHER): Payer: BC Managed Care – PPO | Admitting: Nurse Practitioner

## 2015-12-08 ENCOUNTER — Encounter: Payer: Self-pay | Admitting: Nurse Practitioner

## 2015-12-08 VITALS — BP 110/72 | Temp 98.7°F | Ht 65.0 in | Wt 182.0 lb

## 2015-12-08 DIAGNOSIS — K529 Noninfective gastroenteritis and colitis, unspecified: Secondary | ICD-10-CM

## 2015-12-08 DIAGNOSIS — R1030 Lower abdominal pain, unspecified: Secondary | ICD-10-CM | POA: Diagnosis not present

## 2015-12-08 DIAGNOSIS — R197 Diarrhea, unspecified: Secondary | ICD-10-CM

## 2015-12-08 MED ORDER — DIPHENOXYLATE-ATROPINE 2.5-0.025 MG PO TABS: 2.0000 | ORAL_TABLET | Freq: Four times a day (QID) | ORAL | 0 refills | Status: DC | PRN

## 2015-12-08 NOTE — Patient Instructions (Signed)
Bowel probiotic: Electronics engineer

## 2015-12-09 ENCOUNTER — Encounter: Payer: Self-pay | Admitting: Nurse Practitioner

## 2015-12-09 LAB — BASIC METABOLIC PANEL
BUN/Creatinine Ratio: 16 (ref 9–23)
BUN: 10 mg/dL (ref 6–24)
CO2: 24 mmol/L (ref 18–29)
Calcium: 9.4 mg/dL (ref 8.7–10.2)
Chloride: 101 mmol/L (ref 96–106)
Creatinine, Ser: 0.61 mg/dL (ref 0.57–1.00)
GFR calc non Af Amer: 107 mL/min/{1.73_m2} (ref >59)
GFR, EST AFRICAN AMERICAN: 123 mL/min/{1.73_m2} (ref >59)
Glucose: 78 mg/dL (ref 65–99)
POTASSIUM: 3.9 mmol/L (ref 3.5–5.2)
SODIUM: 140 mmol/L (ref 134–144)

## 2015-12-09 LAB — CBC WITH DIFFERENTIAL/PLATELET
BASOS: 1 %
Basophils Absolute: 0 10*3/uL (ref 0.0–0.2)
EOS (ABSOLUTE): 0.2 10*3/uL (ref 0.0–0.4)
Eos: 2 %
Hematocrit: 42.2 % (ref 34.0–46.6)
Hemoglobin: 13.8 g/dL (ref 11.1–15.9)
IMMATURE GRANULOCYTES: 0 %
Immature Grans (Abs): 0 10*3/uL (ref 0.0–0.1)
Lymphocytes Absolute: 2.7 10*3/uL (ref 0.7–3.1)
Lymphs: 36 %
MCH: 29.2 pg (ref 26.6–33.0)
MCHC: 32.7 g/dL (ref 31.5–35.7)
MCV: 89 fL (ref 79–97)
MONOS ABS: 0.4 10*3/uL (ref 0.1–0.9)
Monocytes: 5 %
Neutrophils Absolute: 4.2 10*3/uL (ref 1.4–7.0)
Neutrophils: 56 %
PLATELETS: 281 10*3/uL (ref 150–379)
RBC: 4.72 x10E6/uL (ref 3.77–5.28)
RDW: 14.2 % (ref 12.3–15.4)
WBC: 7.5 10*3/uL (ref 3.4–10.8)

## 2015-12-09 LAB — HEPATIC FUNCTION PANEL
ALT: 21 IU/L (ref 0–32)
AST: 16 IU/L (ref 0–40)
Albumin: 4.3 g/dL (ref 3.5–5.5)
Alkaline Phosphatase: 104 IU/L (ref 39–117)
Bilirubin Total: <0.2 mg/dL (ref 0.0–1.2)
Bilirubin, Direct: 0.06 mg/dL (ref 0.00–0.40)
Total Protein: 7 g/dL (ref 6.0–8.5)

## 2015-12-09 LAB — LIPASE: Lipase: 45 U/L (ref 0–59)

## 2015-12-09 NOTE — Progress Notes (Signed)
Subjective:  Presents for complaints of persistent diarrhea for the past 15 days. Having 10+ episodes of diarrhea per day. Has it even after drinking a glass of water. Diarrhea very watery. No blood in her stools. No fever. Abdominal pain and cramping only with diarrhea. No recent antibiotics. Began on her second month of Zoloft which she has since stopped. This medicine was working wonderfully for her symptoms, would like to restart if possible. Had a colonoscopy in 2014. No nausea or vomiting. Persistent symptoms despite eating a bland diet. No known contacts. No travel outside the country or known exposure to contaminated food or water. No relief with Imodium.  Objective:   BP 110/72   Temp 98.7 F (37.1 C) (Oral)   Ht 5\' 5"  (1.651 m)   Wt 182 lb (82.6 kg)   BMI 30.29 kg/m  NAD. Alert, oriented. Lungs clear. Heart regular rate rhythm. Abdomen obese soft nondistended with loud hyperactive bowel sounds. Mild generalized lower abdominal tenderness, no rebound or guarding. No obvious masses.  Assessment: Protracted diarrhea - Plan: CBC with Differential/Platelet, Basic metabolic panel, Hepatic function panel, Lipase, Stool culture, Stool C-Diff Toxin Assay  Lower abdominal pain - Plan: CBC with Differential/Platelet, Basic metabolic panel, Hepatic function panel, Lipase, Stool culture, Stool C-Diff Toxin Assay  Plan:  Meds ordered this encounter  Medications  . diphenoxylate-atropine (LOMOTIL) 2.5-0.025 MG tablet    Sig: Take 2 tablets by mouth 4 (four) times daily as needed for diarrhea or loose stools.    Dispense:  30 tablet    Refill:  0    Order Specific Question:   Supervising Provider    Answer:   Maggie Font   Start OTC daily probiotic. Trial of Lomotil to see if this will slow down the number of stools. Labs and stool cultures pending. Warning signs reviewed including fever bloody stools vomiting or worsening abdominal pain. Call or go to ED in the meantime if symptoms  worsen.

## 2015-12-10 LAB — CLOSTRIDIUM DIFFICILE EIA: C difficile Toxins A+B, EIA: NEGATIVE

## 2015-12-12 ENCOUNTER — Encounter: Payer: Self-pay | Admitting: Nurse Practitioner

## 2015-12-12 ENCOUNTER — Telehealth: Payer: Self-pay | Admitting: Nurse Practitioner

## 2015-12-12 DIAGNOSIS — R1084 Generalized abdominal pain: Secondary | ICD-10-CM

## 2015-12-12 DIAGNOSIS — R197 Diarrhea, unspecified: Secondary | ICD-10-CM

## 2015-12-12 LAB — STOOL CULTURE: E coli, Shiga toxin Assay: NEGATIVE

## 2015-12-12 NOTE — Telephone Encounter (Signed)
Please order: C reactive protein Sed rate Tissue transglutaminase IgA  Diagnosis: protracted diarrhea Lower abd pain  Also please send in referral to local GI  Thanks.

## 2015-12-13 ENCOUNTER — Encounter: Payer: Self-pay | Admitting: Gastroenterology

## 2015-12-13 NOTE — Telephone Encounter (Signed)
Blood work ordered in [employer]. Referral ordered in [facility]. Patient notified by Hoyle Sauer via my chart

## 2015-12-16 LAB — C-REACTIVE PROTEIN: CRP: 4.4 mg/L (ref 0.0–4.9)

## 2015-12-16 LAB — SEDIMENTATION RATE: SED RATE: 3 mm/h (ref 0–32)

## 2015-12-16 LAB — TISSUE TRANSGLUTAMINASE, IGA: Transglutaminase IgA: <2 U/mL (ref 0–3)

## 2015-12-19 ENCOUNTER — Encounter: Payer: Self-pay | Admitting: Nurse Practitioner

## 2016-01-11 ENCOUNTER — Other Ambulatory Visit: Payer: Self-pay | Admitting: Family Medicine

## 2016-01-18 ENCOUNTER — Telehealth: Payer: Self-pay | Admitting: Gastroenterology

## 2016-01-18 ENCOUNTER — Encounter: Payer: Self-pay | Admitting: Gastroenterology

## 2016-01-18 ENCOUNTER — Ambulatory Visit: Payer: BC Managed Care – PPO | Admitting: Gastroenterology

## 2016-01-18 NOTE — Telephone Encounter (Signed)
PT WAS A NO SHOW AND LETTER SENT  °

## 2016-05-22 ENCOUNTER — Ambulatory Visit (INDEPENDENT_AMBULATORY_CARE_PROVIDER_SITE_OTHER): Payer: BC Managed Care – PPO | Admitting: Family Medicine

## 2016-05-22 ENCOUNTER — Encounter: Payer: Self-pay | Admitting: Family Medicine

## 2016-05-22 VITALS — BP 120/82 | Temp 98.2°F | Ht 65.0 in | Wt 193.4 lb

## 2016-05-22 DIAGNOSIS — J029 Acute pharyngitis, unspecified: Secondary | ICD-10-CM

## 2016-05-22 DIAGNOSIS — J020 Streptococcal pharyngitis: Secondary | ICD-10-CM

## 2016-05-22 DIAGNOSIS — F411 Generalized anxiety disorder: Secondary | ICD-10-CM | POA: Diagnosis not present

## 2016-05-22 LAB — POCT RAPID STREP A (OFFICE): Rapid Strep A Screen: NEGATIVE

## 2016-05-22 MED ORDER — SERTRALINE HCL 100 MG PO TABS: 100.0000 mg | ORAL_TABLET | Freq: Every day | ORAL | 5 refills | Status: DC

## 2016-05-22 MED ORDER — AMOXICILLIN 500 MG PO TABS
500.0000 mg | ORAL_TABLET | Freq: Three times a day (TID) | ORAL | 0 refills | Status: DC
Start: 2016-05-22 — End: 2016-06-20

## 2016-05-22 NOTE — Progress Notes (Signed)
   Subjective:    Patient ID: Suzanne Hart, female    DOB: 05-Sep-1965, 51 y.o.   MRN: [id number]  Sore Throat   This is a new problem. The current episode started in the past 7 days. The problem has been unchanged. Neither side of throat is experiencing more pain than the other. There has been no fever. The pain is moderate. Associated symptoms include headaches. Associated symptoms comments: chills. She has tried NSAIDs for the symptoms. The treatment provided no relief.   Patient has no other concerns at this time.  States anxiety doing fairly well on Zoloft but not as good as it used to be she is interested in bumping up the dose  Review of Systems  Neurological: Positive for headaches.   Severe sore throat denies wheezing coughing runny nose relates some chills body aches    Objective:   Physical Exam  Throat erythematous neck is supple lungs clear heart regular patient does not appear toxic      Assessment & Plan:  Patient with significant symptoms consistent with strep throat go ahead and treat with antibiotics. Warning signs discussed follow-up if ongoing troubles  Generalized anxiety does well with Zoloft but feels like it's not doing quite enough we will go ahead and increase the dose of the medication to 100 mg daily she will message me within one month on how she is doing with this  Follow-up in 6 months if no other problems

## 2016-05-23 LAB — PLEASE NOTE

## 2016-05-23 LAB — STREP A DNA PROBE: Strep Gp A Direct, DNA Probe: NEGATIVE

## 2016-06-06 ENCOUNTER — Other Ambulatory Visit: Payer: Self-pay | Admitting: Family Medicine

## 2016-06-06 DIAGNOSIS — Z1231 Encounter for screening mammogram for malignant neoplasm of breast: Secondary | ICD-10-CM

## 2016-06-18 ENCOUNTER — Ambulatory Visit (HOSPITAL_COMMUNITY): Payer: BC Managed Care – PPO

## 2016-06-20 ENCOUNTER — Ambulatory Visit (INDEPENDENT_AMBULATORY_CARE_PROVIDER_SITE_OTHER): Payer: BC Managed Care – PPO | Admitting: Nurse Practitioner

## 2016-06-20 ENCOUNTER — Encounter: Payer: Self-pay | Admitting: Nurse Practitioner

## 2016-06-20 VITALS — BP 130/80 | Ht 65.0 in | Wt 192.5 lb

## 2016-06-20 DIAGNOSIS — Z124 Encounter for screening for malignant neoplasm of cervix: Secondary | ICD-10-CM

## 2016-06-20 DIAGNOSIS — Z1151 Encounter for screening for human papillomavirus (HPV): Secondary | ICD-10-CM

## 2016-06-20 DIAGNOSIS — Z01419 Encounter for gynecological examination (general) (routine) without abnormal findings: Secondary | ICD-10-CM

## 2016-06-22 ENCOUNTER — Ambulatory Visit (HOSPITAL_COMMUNITY)
Admission: RE | Admit: 2016-06-22 | Discharge: 2016-06-22 | Disposition: A | Discharge status: Home / Self Care | Payer: BC Managed Care – PPO | Source: Ambulatory Visit | Attending: Family Medicine | Admitting: Family Medicine

## 2016-06-22 ENCOUNTER — Other Ambulatory Visit: Payer: Self-pay | Admitting: Family Medicine

## 2016-06-22 ENCOUNTER — Encounter (HOSPITAL_COMMUNITY): Payer: Self-pay

## 2016-06-22 ENCOUNTER — Encounter: Payer: Self-pay | Admitting: Nurse Practitioner

## 2016-06-22 DIAGNOSIS — R928 Other abnormal and inconclusive findings on diagnostic imaging of breast: Secondary | ICD-10-CM

## 2016-06-22 DIAGNOSIS — Z1231 Encounter for screening mammogram for malignant neoplasm of breast: Secondary | ICD-10-CM | POA: Diagnosis not present

## 2016-06-22 LAB — PAP IG AND HPV HIGH-RISK
HPV, HIGH-RISK: NEGATIVE
PAP Smear Comment: 0

## 2016-06-22 NOTE — Progress Notes (Signed)
   Subjective:    Patient ID: Suzanne Hart, female    DOB: [birth year], 51 y.o.   MRN: [id number]  HPI presents for her wellness exam. Married, same sexual partner. Regular vision and dental exams. Has mammogram scheduled for Friday. Had colonoscopy 2014 which she states was normal. Review of record states next one is due 2024. Total abd hysterectomy in 2005. Patient states it was supracervical but no cervix was noted on exam, just vaginal cuff. A review of records after her visit reveals cervix was removed.     Review of Systems  Constitutional: Negative for activity change, appetite change and fatigue.  HENT: Negative for dental problem, ear pain, sinus pressure and sore throat.   Respiratory: Negative for cough, chest tightness, shortness of breath and wheezing.   Cardiovascular: Negative for chest pain.  Gastrointestinal: Negative for abdominal distention, abdominal pain, constipation, diarrhea, nausea and vomiting.  Genitourinary: Negative for difficulty urinating, dysuria, enuresis, frequency, genital sores, pelvic pain, urgency and vaginal discharge.       Objective:   Physical Exam  Constitutional: She is oriented to person, place, and time. She appears well-developed. No distress.  HENT:  Right Ear: External ear normal.  Left Ear: External ear normal.  Mouth/Throat: Oropharynx is clear and moist.  Neck: Normal range of motion. Neck supple. No tracheal deviation present. No thyromegaly present.  Cardiovascular: Normal rate, regular rhythm and normal heart sounds.  Exam reveals no gallop.   No murmur heard. Pulmonary/Chest: Effort normal and breath sounds normal.  Abdominal: Soft. She exhibits no distension. There is no tenderness.  Genitourinary: Vagina normal. No vaginal discharge found.  Genitourinary Comments: External GU: no rashes or lesions. Vagina: minimal white mucoid discharge. No cervix; PAP obtained from vaginal cuff. Bimanual exam: no tenderness or masses.     Musculoskeletal: She exhibits no edema.  Lymphadenopathy:    She has no cervical adenopathy.  Neurological: She is alert and oriented to person, place, and time.  Skin: Skin is warm and dry. No rash noted.  Psychiatric: She has a normal mood and affect. Her behavior is normal.  Vitals reviewed. Breast exam: dense tissue; no masses; axillae no adenopathy.         Assessment & Plan:  Well woman exam - Plan: Pap IG and HPV (high risk) DNA detection, Basic metabolic panel, Lipid panel, Hepatic function panel, TSH, VITAMIN D 25 Hydroxy (Vit-D Deficiency, Fractures)  Screening for cervical cancer - Plan: Pap IG and HPV (high risk) DNA detection  Screening for HPV (human papillomavirus) - Plan: Pap IG and HPV (high risk) DNA detection  Recommend healthy diet, exercise and weight loss. Daily vitamin D and calcium.  Follow up as planned on 4/6. Next physical in one year.

## 2016-06-26 ENCOUNTER — Other Ambulatory Visit: Payer: Self-pay | Admitting: Family Medicine

## 2016-06-26 ENCOUNTER — Ambulatory Visit (HOSPITAL_COMMUNITY)
Admission: RE | Admit: 2016-06-26 | Discharge: 2016-06-26 | Disposition: A | Discharge status: Home / Self Care | Payer: BC Managed Care – PPO | Source: Ambulatory Visit | Attending: Family Medicine | Admitting: Family Medicine

## 2016-06-26 DIAGNOSIS — R928 Other abnormal and inconclusive findings on diagnostic imaging of breast: Secondary | ICD-10-CM

## 2016-06-26 DIAGNOSIS — N6001 Solitary cyst of right breast: Secondary | ICD-10-CM | POA: Insufficient documentation

## 2016-06-26 DIAGNOSIS — N6322 Unspecified lump in the left breast, upper inner quadrant: Secondary | ICD-10-CM | POA: Diagnosis not present

## 2016-07-03 ENCOUNTER — Ambulatory Visit (HOSPITAL_COMMUNITY)
Admission: RE | Admit: 2016-07-03 | Discharge: 2016-07-03 | Disposition: A | Discharge status: Home / Self Care | Payer: BC Managed Care – PPO | Source: Ambulatory Visit | Attending: Family Medicine | Admitting: Family Medicine

## 2016-07-03 ENCOUNTER — Other Ambulatory Visit: Payer: Self-pay | Admitting: Family Medicine

## 2016-07-03 DIAGNOSIS — N6322 Unspecified lump in the left breast, upper inner quadrant: Secondary | ICD-10-CM | POA: Insufficient documentation

## 2016-07-03 DIAGNOSIS — N6320 Unspecified lump in the left breast, unspecified quadrant: Secondary | ICD-10-CM

## 2016-07-03 DIAGNOSIS — R928 Other abnormal and inconclusive findings on diagnostic imaging of breast: Secondary | ICD-10-CM

## 2016-07-03 DIAGNOSIS — D242 Benign neoplasm of left breast: Secondary | ICD-10-CM | POA: Diagnosis not present

## 2016-07-03 MED ORDER — LIDOCAINE-EPINEPHRINE (PF) 1 %-1:200000 IJ SOLN
INTRAMUSCULAR | Status: DC
Start: 2016-07-03 — End: 2016-07-04
  Filled 2016-07-03: qty 30

## 2016-07-03 MED ORDER — LIDOCAINE HCL (PF) 1 % IJ SOLN
INTRAMUSCULAR | Status: AC
  Filled 2016-07-03: qty 5

## 2016-11-14 ENCOUNTER — Encounter: Payer: Self-pay | Admitting: Family Medicine

## 2016-11-14 ENCOUNTER — Ambulatory Visit (INDEPENDENT_AMBULATORY_CARE_PROVIDER_SITE_OTHER): Payer: BC Managed Care – PPO | Admitting: Family Medicine

## 2016-11-14 VITALS — BP 118/74 | Temp 98.7°F | Ht 65.0 in | Wt 199.0 lb

## 2016-11-14 DIAGNOSIS — S61216A Laceration without foreign body of right little finger without damage to nail, initial encounter: Secondary | ICD-10-CM | POA: Diagnosis not present

## 2016-11-14 DIAGNOSIS — Z23 Encounter for immunization: Secondary | ICD-10-CM | POA: Diagnosis not present

## 2016-11-14 DIAGNOSIS — G43109 Migraine with aura, not intractable, without status migrainosus: Secondary | ICD-10-CM

## 2016-11-14 DIAGNOSIS — L03818 Cellulitis of other sites: Secondary | ICD-10-CM | POA: Diagnosis not present

## 2016-11-14 MED ORDER — ONDANSETRON HCL 8 MG PO TABS: 8.0000 mg | ORAL_TABLET | Freq: Three times a day (TID) | ORAL | 2 refills | Status: DC | PRN

## 2016-11-14 MED ORDER — AMOXICILLIN-POT CLAVULANATE 875-125 MG PO TABS: 1.0000 | ORAL_TABLET | Freq: Two times a day (BID) | ORAL | 0 refills | Status: DC

## 2016-11-14 NOTE — Progress Notes (Signed)
   Subjective:    Patient ID: Suzanne Hart, female    DOB: [birth year], 51 y.o.   MRN: [id number]  Hand Pain   Incident onset: 3 days ago. Pertinent negatives include no chest pain. Treatments tried: peroxide, neosporin.  cut right pinky finger on a knife in the kitchen sink. Pain and swelling.  Finger infection to some degree after cutting her finger some redness bruising around relate significant amount of pain denies high fever chills sweats or other troubles but did have some chills earlier today Headache, chills, diarrhea today.   The headaches do not wake her up in the middle the night it's more of a throbbing with visual aura but before it if she lays and is sleepy gets better  Review of Systems  Constitutional: Negative for activity change and appetite change.  HENT: Negative for congestion.   Respiratory: Negative for cough.   Cardiovascular: Negative for chest pain.  Gastrointestinal: Negative for abdominal pain and vomiting.  Skin: Negative for color change.  Neurological: Negative for weakness.  Psychiatric/Behavioral: Negative for confusion.       Objective:   Physical Exam  Constitutional: She appears well-nourished. No distress.  HENT:  Head: Normocephalic.  Right Ear: External ear normal.  Left Ear: External ear normal.  Eyes: Right eye exhibits no discharge. Left eye exhibits no discharge.  Neck: No tracheal deviation present.  Cardiovascular: Normal rate, regular rhythm and normal heart sounds.   No murmur heard. Pulmonary/Chest: Effort normal and breath sounds normal. No respiratory distress. She has no wheezes. She has no rales.  Musculoskeletal: She exhibits no edema.  Lymphadenopathy:    She has no cervical adenopathy.  Neurological: She is alert.  Psychiatric: Her behavior is normal.  Vitals reviewed.   Laceration noted on the right small finger no other particular troubles some redness and bruising around it no hand infection in the middle of the  hand Patient does relate intermittent migraines. Wonders what she could take for this I recommended anti-inflammatories she'll keep track of headaches if it becomes more prominent she will follow-up May use Zofran when necessary for nausea    Assessment & Plan:  Laceration Tetanus shot Antibiotics Localized cellulitis Should gradually get better warnings discussed

## 2017-04-20 IMAGING — US US BREAST BX W LOC DEV 1ST LESION IMG BX SPEC US GUIDE*L*
1 series · 9 of 9 positions shown · non-contrast
Comparison: Previous exam(s).

ADDENDUM:
Pathology results: Pathology results from the ultrasound-guided
biopsy of the mass in the left breast at the 10 o'clock position
revealed a benign fibroadenoma. This is concordant with the imaging
findings. The patient has been notified of the results. She is doing
well and denies any biopsy site complications.

Annual routine screening mammography is recommended. The patient has
been instructed to call the [REDACTED] with any questions or
concerns.
CLINICAL DATA: 50-year-old female with an indeterminate likely
intraductal mass in the left breast at the 10 o'clock position.
EXAM:
ULTRASOUND GUIDED LEFT BREAST CORE NEEDLE BIOPSY

[Series 1: us breast bx w loc dev 1st lesion img bx spec us g · 0.07mm/px · 9 of 9 slices shown]
[im 1/9]
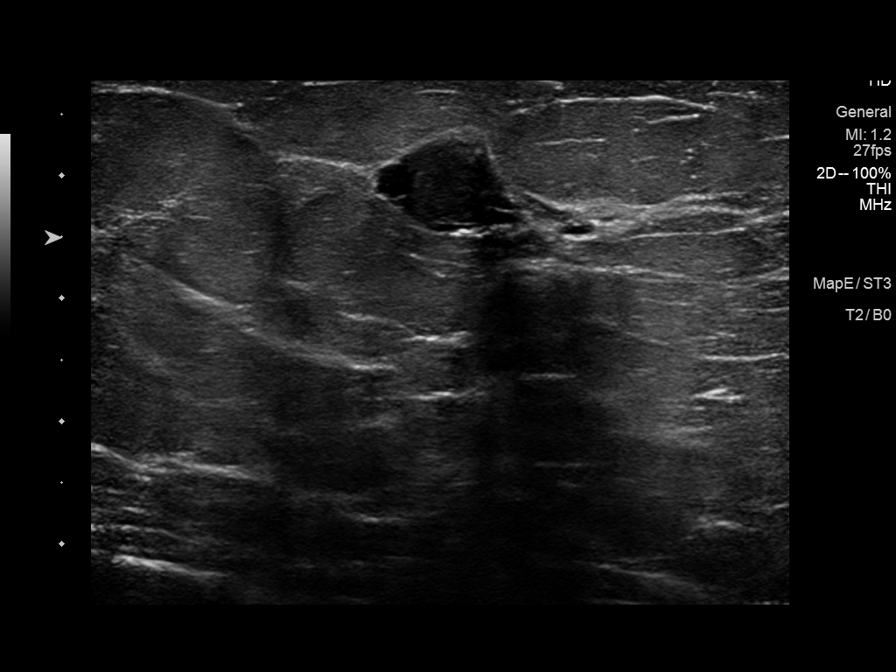
[im 2/9]
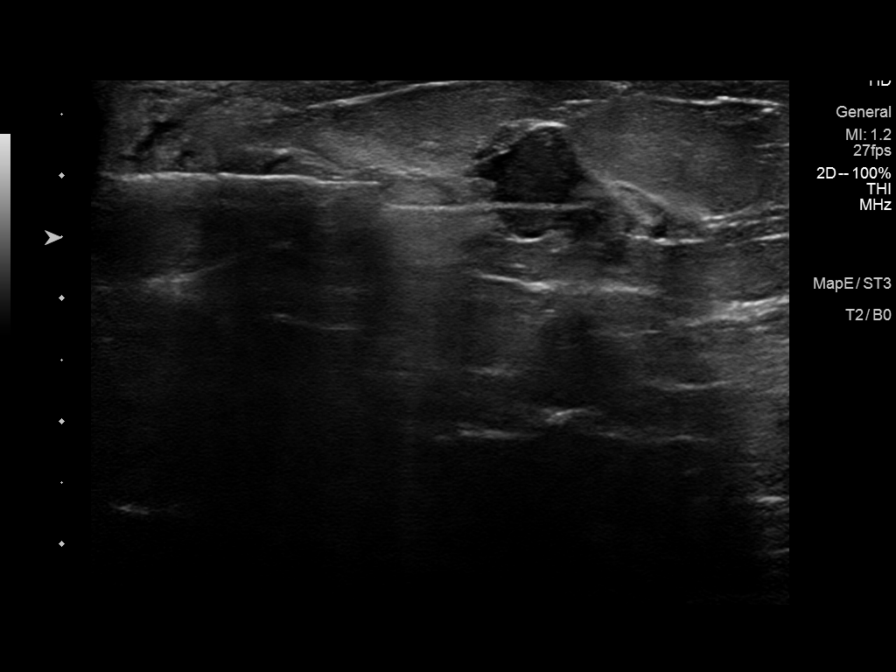
[im 3/9]
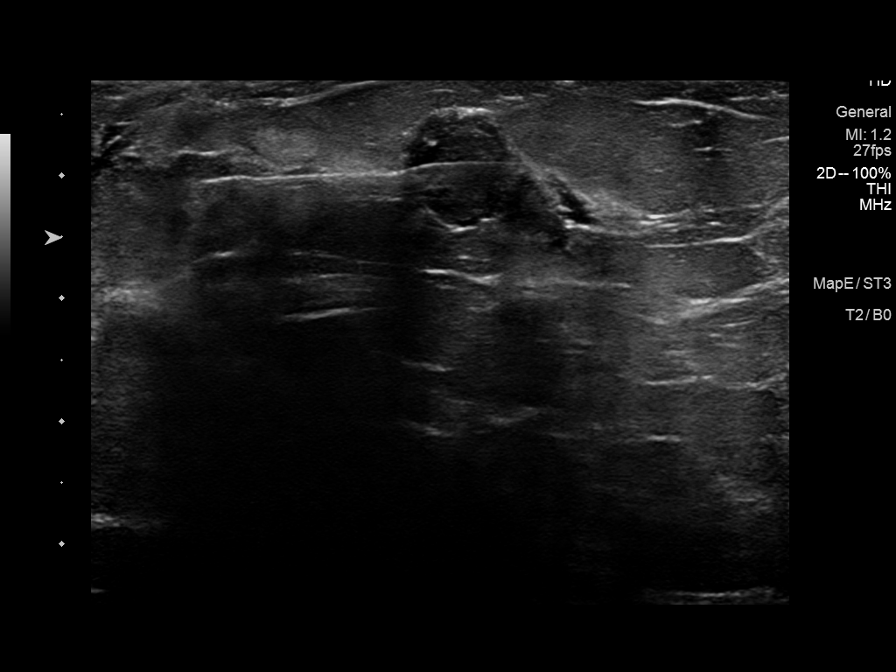
[im 4/9]
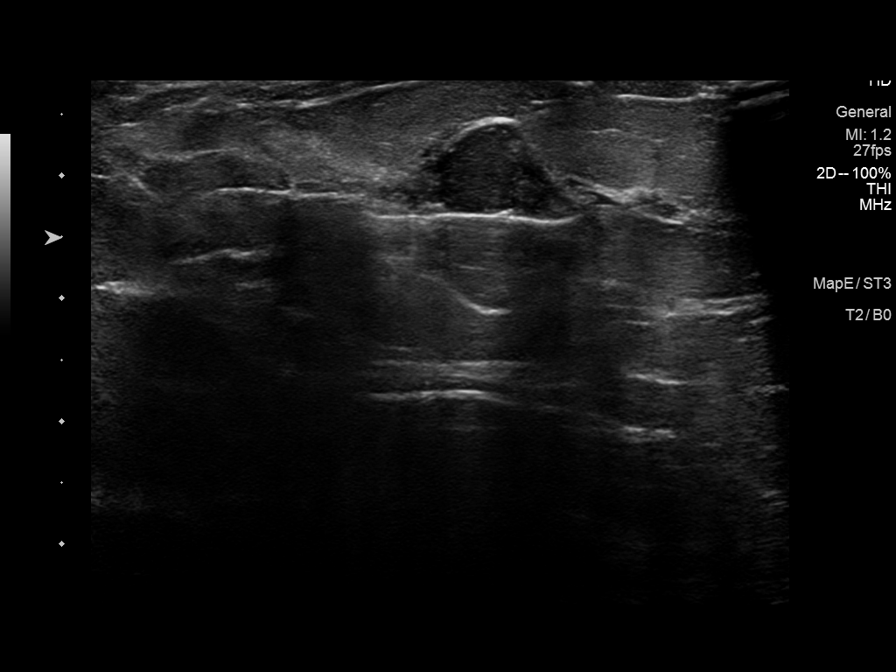
[im 5/9]
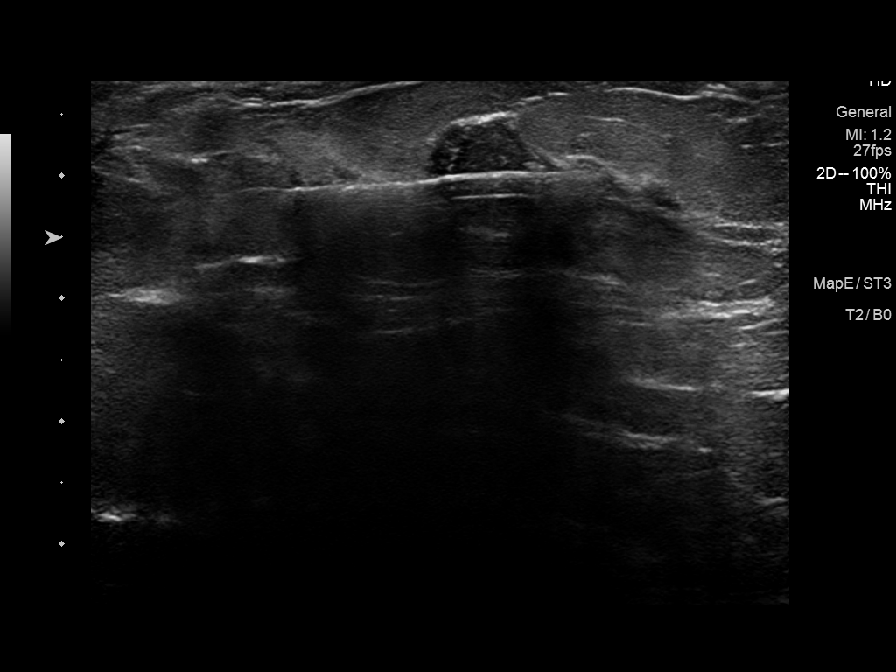
[im 6/9]
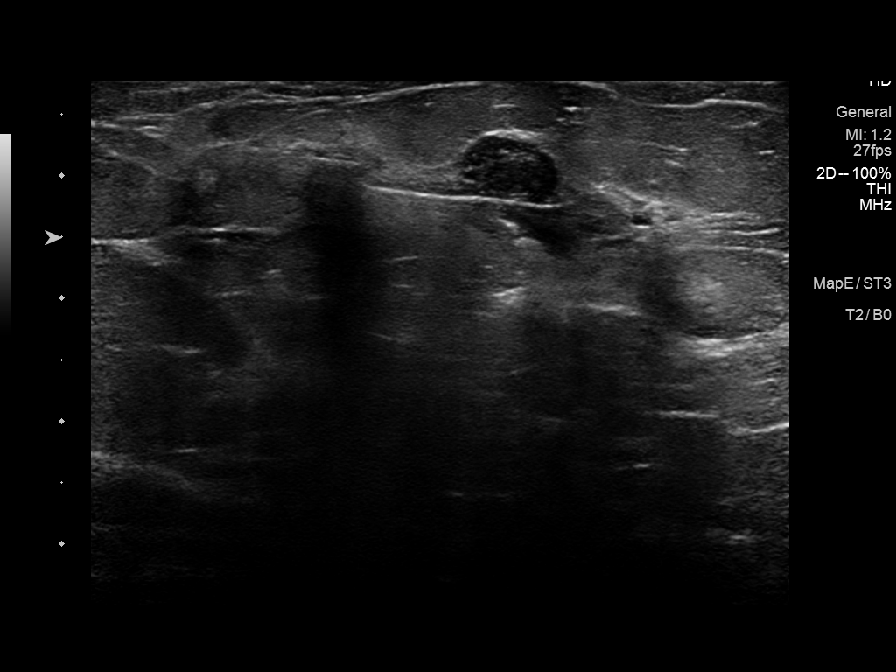
[im 7/9]
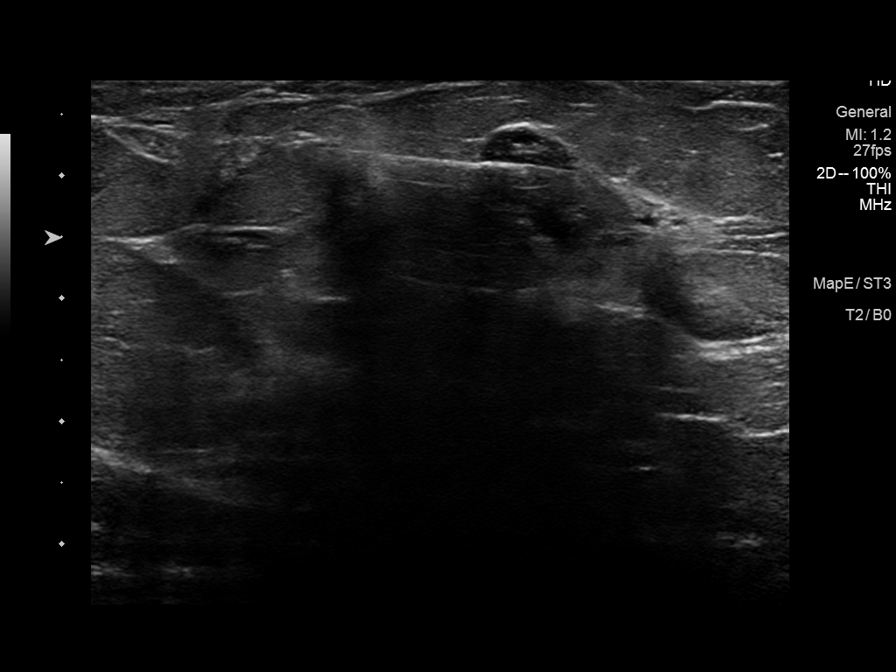
[im 8/9]
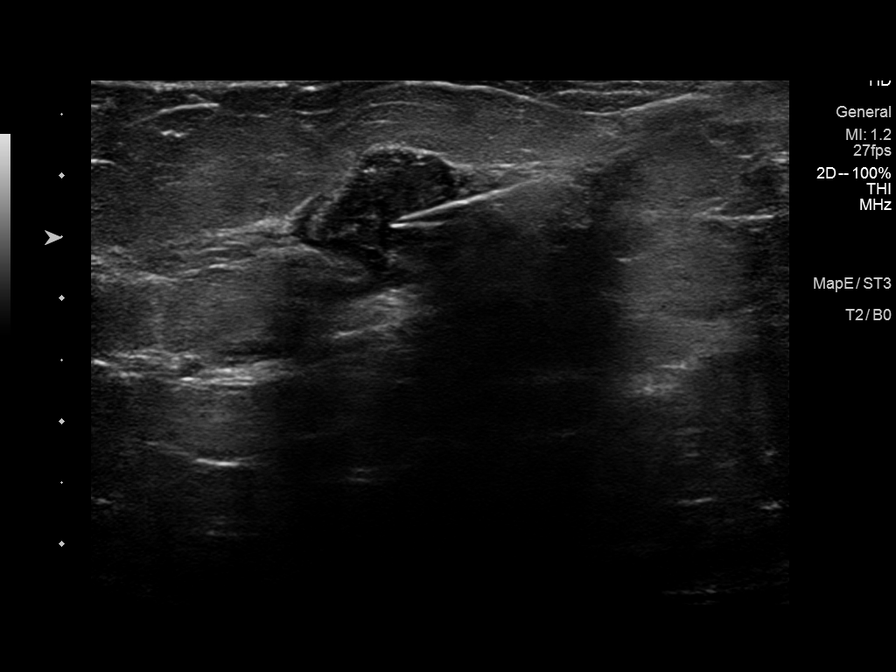
[im 9/9]
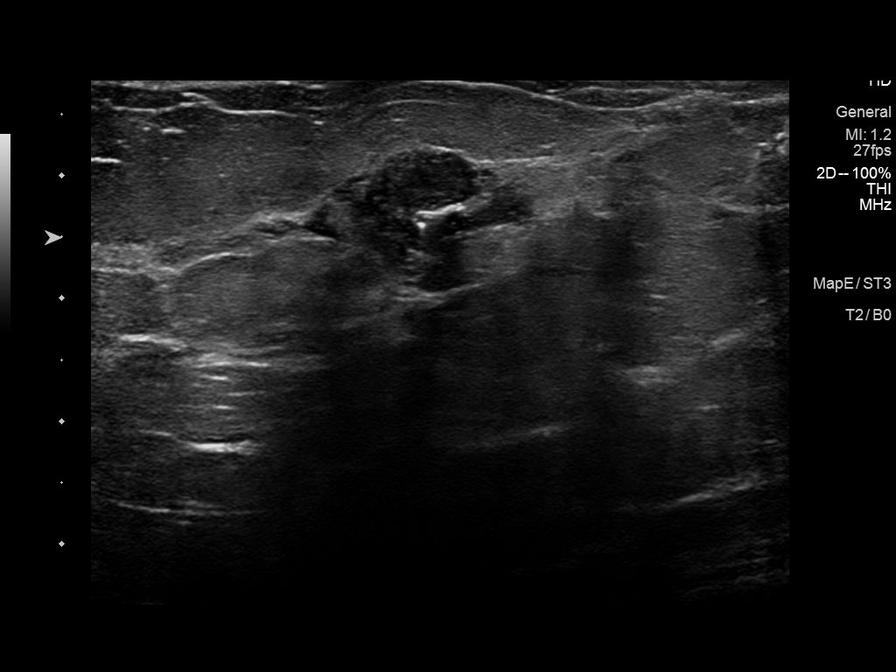

[9 of 9 positions shown; findings below may reference images not displayed]



Lesion quadrant: Upper inner

Using sterile technique and 1% Lidocaine as local anesthetic, under
direct ultrasound visualization, a 14 gauge Chato device was
used to perform biopsy of the mass in the upper inner quadrant at
the 10 o'clock position using a medial to lateral approach. At the
conclusion of the procedure a ribbon shaped tissue marker clip was
deployed into the biopsy cavity. Follow up 2 view mammogram was
performed and dictated separately.
IMPRESSION: Ultrasound guided biopsy of the mass in the left breast at the 10
o'clock position. No apparent complications.

## 2017-06-04 ENCOUNTER — Other Ambulatory Visit: Payer: Self-pay | Admitting: Family Medicine

## 2017-08-09 ENCOUNTER — Other Ambulatory Visit: Payer: Self-pay | Admitting: Family Medicine

## 2017-08-09 NOTE — Telephone Encounter (Signed)
This +1 refill needs follow-up office visit 

## 2017-08-19 ENCOUNTER — Encounter: Payer: Self-pay | Admitting: Family Medicine

## 2017-08-19 ENCOUNTER — Ambulatory Visit: Payer: BC Managed Care – PPO | Admitting: Family Medicine

## 2017-08-19 VITALS — BP 138/90 | Temp 98.6°F | Ht 65.0 in | Wt 203.8 lb

## 2017-08-19 DIAGNOSIS — J029 Acute pharyngitis, unspecified: Secondary | ICD-10-CM

## 2017-08-19 LAB — POCT RAPID STREP A (OFFICE): RAPID STREP A SCREEN: NEGATIVE

## 2017-08-19 MED ORDER — DOXYCYCLINE HYCLATE 100 MG PO TABS: 100.0000 mg | ORAL_TABLET | Freq: Two times a day (BID) | ORAL | 0 refills | Status: DC

## 2017-08-19 NOTE — Progress Notes (Signed)
   Subjective:    Patient ID: Suzanne Hart, female    DOB: Jun 04, 1965, 52 y.o.   MRN: [id number]  Sore Throat   This is a new problem. The current episode started in the past 7 days. The pain is worse on the left side. Associated symptoms include abdominal pain, ear pain and headaches. Associated symptoms comments: Fever, chills. Treatments tried: advil.   Pt stated she had a pretty bad tick bite a couple weeks ago. Tick bite was on back.  Results for orders placed or performed in visit on 08/19/17  POCT rapid strep A  Result Value Ref Range   Rapid Strep A Screen Negative Negative   Very bad headache on Friday  stillsome now,  Chills severe  Sweaty and hot and no energy yesterda  Pos pain in left andt cerv region withswallowing and touch  achey in joints elbows and knees  Cramping in abd , no diarrhea  Energy  Level   Not good, appetite down    Used sdvil rn     Review of Systems  HENT: Positive for ear pain.   Gastrointestinal: Positive for abdominal pain.  Neurological: Positive for headaches.       Objective:   Physical Exam  Alert active good hydration moderate malaise HEENT slight erythema throat.  Lungs clear.  Heart regular rate and rhythm abdomen benign      Assessment & Plan:  1 impression probable viral syndrome.  Discussed at length.  However will cover with doxycycline twice daily for slight chance of tickborne illness rationale discussed

## 2017-08-20 LAB — SPECIMEN STATUS REPORT

## 2017-08-20 LAB — STREP A DNA PROBE: STREP GP A DIRECT, DNA PROBE: POSITIVE — AB

## 2017-08-22 ENCOUNTER — Encounter: Payer: Self-pay | Admitting: Family Medicine

## 2017-08-22 ENCOUNTER — Other Ambulatory Visit: Payer: Self-pay | Admitting: Family Medicine

## 2017-08-22 MED ORDER — SULFACETAMIDE SODIUM 10 % OP SOLN: OPHTHALMIC | 0 refills | Status: DC

## 2017-08-22 NOTE — Telephone Encounter (Signed)
Spoke with patient and she states that eyes are itching, draining, and red and crusty. Pt is not allergic to sulfa. Sent in eye drops to [facility].

## 2017-12-09 ENCOUNTER — Telehealth: Payer: Self-pay | Admitting: Family Medicine

## 2017-12-09 ENCOUNTER — Encounter: Payer: Self-pay | Admitting: Family Medicine

## 2017-12-09 NOTE — Telephone Encounter (Signed)
I spoke with the pt and she is aware we have no available appt's for today. She was instructed that if she thought she needed to be evaluated she would need to be checked out at an urgent care or ed. Patient states understanding.

## 2017-12-09 NOTE — Telephone Encounter (Signed)
Nurse Triage- patient has inner ear pain, dizziness next appt. 4:50

## 2017-12-11 ENCOUNTER — Telehealth: Payer: BC Managed Care – PPO | Admitting: Family Medicine

## 2017-12-11 DIAGNOSIS — R42 Dizziness and giddiness: Secondary | ICD-10-CM

## 2017-12-11 NOTE — Progress Notes (Signed)
Based on what you shared with me it looks like you have a condition that should be evaluated in a face to face office visit.  Meclizine is part of our treatment plan and if you have already attempted this medication without relief it is best you seek face to face evaluation so that you can have access to expanded treatment options. Apologies for the inconvenience, wishing you a speedy recovery.  NOTE: If you entered your credit card information for this eVisit, you will not be charged. You may see a "hold" on your card for the $30 but that hold will drop off and you will not have a charge processed.  If you are having a true medical emergency please call 911.  If you need an urgent face to face visit, [place] has four urgent care centers for your convenience.  If you need care fast and have a high deductible or no insurance consider:   [email or web address] to reserve your spot online an avoid wait times  [facility] [address], [address] [place], [place] [address] 8 am to 8 pm Monday-Friday 10 am to 4 pm Saturday-Sunday *Across the street from [employer]  [place], [address] 8 am to 5 pm Monday-Friday * In the [facility] on the [facility]   The following sites will take your  insurance:  . [facility] Urgent [place] a Provider at this Location  [address] [place], [place] [address] . 10 am to 8 pm Monday-Friday . 12 pm to 8 pm Saturday-Sunday   . [facility] Urgent Care at [place] a Provider at this Location  [address], [place], [place] [address] . 8 am to 8 pm Monday-Friday . 9 am to 6 pm Saturday . 11 am to 6 pm Sunday   . [facility] Urgent Care at [place] Get Driving Directions  [address] [place], [place] [address] . 8 am  to 8 pm Monday-Friday . 8 am to 4 pm Saturday-Sunday   Your e-visit answers were reviewed by a board certified advanced clinical practitioner to complete your personal care plan.  Thank you for using e-Visits.

## 2017-12-11 NOTE — Telephone Encounter (Signed)
Contacted patient to get more information. Pt states that her dizziness started last Thursday and the spinning and vomiting happened on Monday night. Pt states she did the exercises that provider recommended and that "set it off".  Asked patient if she could come in sometime soon to be seen and she said "yes" pt unable to come in Friday but can come in next week. Pt was transferred front to get an appointment.

## 2017-12-16 ENCOUNTER — Ambulatory Visit: Payer: BC Managed Care – PPO | Admitting: Family Medicine

## 2017-12-30 ENCOUNTER — Encounter: Payer: Self-pay | Admitting: Family Medicine

## 2018-01-01 ENCOUNTER — Telehealth: Payer: Self-pay | Admitting: Family Medicine

## 2018-01-01 DIAGNOSIS — R5383 Other fatigue: Secondary | ICD-10-CM

## 2018-01-01 DIAGNOSIS — R42 Dizziness and giddiness: Secondary | ICD-10-CM

## 2018-01-01 NOTE — Telephone Encounter (Signed)
Patient is aware the orders are in.

## 2018-01-01 NOTE — Telephone Encounter (Signed)
I would recommend CBC, met 7 , TSH, free T4 Reason fatigue and dizziness

## 2018-01-01 NOTE — Telephone Encounter (Signed)
Pt states she talked to dr scott through my chart about her dizziness and was told to make a standard office and ask the front staff to take a message to send to him to see if she needed bloodwork. She made appt for oct 30th and wants bw orders.

## 2018-01-01 NOTE — Telephone Encounter (Signed)
Patient has an appt scheduled for Oct. 30th, 2019 for dizziness and pt stated Dr.Scott recommended her to get blood work before hand advise.

## 2018-01-07 LAB — CBC WITH DIFFERENTIAL/PLATELET
Basophils Absolute: 0.1 x10E3/uL (ref 0.0–0.2)
Basos: 1 %
EOS (ABSOLUTE): 0.2 x10E3/uL (ref 0.0–0.4)
Eos: 2 %
Hematocrit: 43.7 % (ref 34.0–46.6)
Hemoglobin: 14.1 g/dL (ref 11.1–15.9)
Immature Grans (Abs): 0 x10E3/uL (ref 0.0–0.1)
Immature Granulocytes: 0 %
Lymphocytes Absolute: 3 x10E3/uL (ref 0.7–3.1)
Lymphs: 35 %
MCH: 29.6 pg (ref 26.6–33.0)
MCHC: 32.3 g/dL (ref 31.5–35.7)
MCV: 92 fL (ref 79–97)
Monocytes Absolute: 0.5 x10E3/uL (ref 0.1–0.9)
Monocytes: 5 %
Neutrophils Absolute: 4.9 x10E3/uL (ref 1.4–7.0)
Neutrophils: 57 %
Platelets: 315 x10E3/uL (ref 150–450)
RBC: 4.77 x10E6/uL (ref 3.77–5.28)
RDW: 13.5 % (ref 12.3–15.4)
WBC: 8.7 x10E3/uL (ref 3.4–10.8)

## 2018-01-07 LAB — BASIC METABOLIC PANEL WITH GFR
BUN/Creatinine Ratio: 17 (ref 9–23)
BUN: 12 mg/dL (ref 6–24)
CO2: 24 mmol/L (ref 20–29)
Calcium: 9.8 mg/dL (ref 8.7–10.2)
Chloride: 100 mmol/L (ref 96–106)
Creatinine, Ser: 0.7 mg/dL (ref 0.57–1.00)
GFR calc Af Amer: 115 mL/min/1.73 (ref >59)
GFR calc non Af Amer: 100 mL/min/1.73 (ref >59)
Glucose: 108 mg/dL — ABNORMAL HIGH (ref 65–99)
Potassium: 4.7 mmol/L (ref 3.5–5.2)
Sodium: 138 mmol/L (ref 134–144)

## 2018-01-07 LAB — TSH: TSH: 1.88 u[IU]/mL (ref 0.450–4.500)

## 2018-01-07 LAB — T4, FREE: FREE T4: 1.04 ng/dL (ref 0.82–1.77)

## 2018-01-15 ENCOUNTER — Ambulatory Visit: Payer: BC Managed Care – PPO | Admitting: Family Medicine

## 2018-01-15 ENCOUNTER — Encounter: Payer: Self-pay | Admitting: Family Medicine

## 2018-01-15 VITALS — BP 122/76 | Temp 98.6°F | Ht 65.0 in | Wt 205.2 lb

## 2018-01-15 DIAGNOSIS — R42 Dizziness and giddiness: Secondary | ICD-10-CM | POA: Diagnosis not present

## 2018-01-15 DIAGNOSIS — R2681 Unsteadiness on feet: Secondary | ICD-10-CM | POA: Diagnosis not present

## 2018-01-15 DIAGNOSIS — Z23 Encounter for immunization: Secondary | ICD-10-CM

## 2018-01-15 NOTE — Progress Notes (Signed)
   Subjective:    Patient ID: Suzanne Hart, female    DOB: 1966-01-11, 52 y.o.   MRN: [id number]  Dizziness  This is a recurrent problem. The current episode started more than 1 year ago. The problem occurs intermittently. The problem has been waxing and waning. Associated symptoms include neck pain. Pertinent negatives include no abdominal pain, chest pain, congestion, coughing, fatigue, nausea, vomiting or weakness. The symptoms are aggravated by bending, coughing and twisting. 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 The treatment provided mild relief.   Unsteady 2 to 3 days aweek Vertigo occas Severe for 1 week Patient relates it is worse when she tries roll to her side when she tries to sit up Patient also relates a lot of fatigue tiredness snores a lot at nighttime She does not know if she stops breathing   Review of Systems  Constitutional: Negative for activity change, appetite change and fatigue.  HENT: Negative for congestion and rhinorrhea.   Respiratory: Negative for cough and shortness of breath.   Cardiovascular: Negative for chest pain and leg swelling.  Gastrointestinal: Negative for abdominal pain, diarrhea, nausea and vomiting.  Endocrine: Negative for polydipsia and polyphagia.  Musculoskeletal: Positive for neck pain.  Skin: Negative for color change.  Neurological: Positive for dizziness. Negative for weakness.  Psychiatric/Behavioral: Negative for agitation, behavioral problems and confusion.       Objective:   Physical Exam  Constitutional: She appears well-nourished. No distress.  HENT:  Head: Normocephalic and atraumatic.  Eyes: Right eye exhibits no discharge. Left eye exhibits no discharge.  Neck: No tracheal deviation present.  Cardiovascular: Normal rate, regular rhythm and normal heart sounds.  No murmur heard. Pulmonary/Chest: Effort normal and breath sounds normal. No respiratory distress.  Musculoskeletal: She exhibits no edema.    Lymphadenopathy:    She has no cervical adenopathy.  Neurological: She is alert. Coordination normal.  Skin: Skin is warm and dry.  Psychiatric: She has a normal mood and affect. Her behavior is normal.  Vitals reviewed.   Finger-to-nose normal Romberg some unsteadiness no difficulty tracking with her eyes      Assessment & Plan:  Fatigue tiredness to do [place] questionnaire   Intermittent vertigo more than likely inner ear hold off on specialist referral if worse patient will notify us we will help set up  Intermittent unsteadiness with dizziness carotid ultrasound because patient states she gets dizzy whenever she coughs or laughs  No additional lab work today  May need sleep study

## 2018-02-06 ENCOUNTER — Telehealth: Payer: Self-pay | Admitting: Family Medicine

## 2018-02-06 NOTE — Telephone Encounter (Signed)
Called pt & explained & apologized for the delay in scheduling her U/S  (accidentally overlooked in on the schedule orders report)  Pt verbalized understanding  U/S has been scheduled and pt notified

## 2018-02-19 ENCOUNTER — Ambulatory Visit (HOSPITAL_COMMUNITY)
Admission: RE | Admit: 2018-02-19 | Discharge: 2018-02-19 | Disposition: A | Discharge status: Home / Self Care | Payer: BC Managed Care – PPO | Source: Ambulatory Visit | Attending: Family Medicine | Admitting: Family Medicine

## 2018-02-19 DIAGNOSIS — R42 Dizziness and giddiness: Secondary | ICD-10-CM | POA: Insufficient documentation

## 2018-05-15 ENCOUNTER — Other Ambulatory Visit: Payer: Self-pay | Admitting: Family Medicine

## 2018-05-15 NOTE — Telephone Encounter (Signed)
Patient may have 30 days with 2 additional refills needs office visit within 90 days Send her my chart message regarding this as well thank you

## 2018-05-16 ENCOUNTER — Encounter: Payer: Self-pay | Admitting: *Deleted

## 2018-11-04 ENCOUNTER — Other Ambulatory Visit: Payer: Self-pay

## 2018-11-04 DIAGNOSIS — Z20822 Contact with and (suspected) exposure to covid-19: Secondary | ICD-10-CM

## 2018-11-05 LAB — NOVEL CORONAVIRUS, NAA: SARS-CoV-2, NAA: NOT DETECTED

## 2019-01-22 ENCOUNTER — Ambulatory Visit (INDEPENDENT_AMBULATORY_CARE_PROVIDER_SITE_OTHER): Payer: BC Managed Care – PPO | Admitting: Family Medicine

## 2019-01-22 ENCOUNTER — Other Ambulatory Visit: Payer: Self-pay

## 2019-01-22 DIAGNOSIS — G43109 Migraine with aura, not intractable, without status migrainosus: Secondary | ICD-10-CM | POA: Diagnosis not present

## 2019-01-22 MED ORDER — ETODOLAC 400 MG PO TABS: ORAL_TABLET | ORAL | 2 refills | Status: DC

## 2019-01-22 MED ORDER — ONDANSETRON 4 MG PO TBDP: 4.0000 mg | ORAL_TABLET | Freq: Three times a day (TID) | ORAL | 1 refills | Status: DC | PRN

## 2019-01-22 NOTE — Progress Notes (Signed)
   Subjective:  Audio  Patient ID: Suzanne Hart, female    DOB: [birth year], 53 y.o.   MRN: [id number]  HPIheadache started Friday night and woke up Saturday with nausea then felt better for 2 days, no fever. Then on Tuesday left work early and slept most of the day and night and left work again Wednesday due to not having any energy. Pt is at work now.   Virtual Visit via Telephone Note  I connected with Madelein Hedgepeth Casler on 01/22/19 at  4:10 PM EST by telephone and verified that I am speaking with the correct person using two identifiers.  Location: Patient: home Provider: office   I discussed the limitations, risks, security and privacy concerns of performing an evaluation and management service by telephone and the availability of in person appointments. I also discussed with the patient that there may be a patient responsible charge related to this service. The patient expressed understanding and agreed to proceed.   History of Present Illness:    Observations/Objective:   Assessment and Plan:   Follow Up Instructions:    I discussed the assessment and treatment plan with the patient. The patient was provided an opportunity to ask questions and all were answered. The patient agreed with the plan and demonstrated an understanding of the instructions.   The patient was advised to call back or seek an in-person evaluation if the symptoms worsen or if the condition fails to improve as anticipated.  I provided 18 minutes of non-face-to-face time during this encounter.    fri had classic bigr zigzagging and h a and nausea    Review of Systems No headache, no major weight loss or weight gain, no chest pain no back pain abdominal pain no change in bowel habits complete ROS otherwise negative     Objective:   Physical Exam   Virtual     Assessment & Plan:  Impression worsening migraine headaches.  Options discussed.  Lodine 400 mg 1 p.o. as needed for headache.  Add Zofran  at the same time.  Symptom care discussed.  Discussed Topamax with patient wishes to hold off on primary prophylaxis for now.

## 2019-02-25 ENCOUNTER — Telehealth: Payer: BC Managed Care – PPO | Admitting: Nurse Practitioner

## 2019-02-25 DIAGNOSIS — B9789 Other viral agents as the cause of diseases classified elsewhere: Secondary | ICD-10-CM | POA: Diagnosis not present

## 2019-02-25 DIAGNOSIS — J329 Chronic sinusitis, unspecified: Secondary | ICD-10-CM

## 2019-02-25 MED ORDER — FLUTICASONE PROPIONATE 50 MCG/ACT NA SUSP: 2.0000 | Freq: Every day | NASAL | 6 refills | Status: DC

## 2019-02-25 NOTE — Progress Notes (Signed)
We are sorry that you are not feeling well.  Here is how we plan to help!  Based on what you have shared with me it looks like you have sinusitis.  Sinusitis is inflammation and infection in the sinus cavities of the head.  Based on your presentation I believe you most likely have Acute Viral Sinusitis.This is an infection most likely caused by a virus. There is not specific treatment for viral sinusitis other than to help you with the symptoms until the infection runs its course.  You may use an oral decongestant such as Mucinex D or if you have glaucoma or high blood pressure use plain Mucinex. Saline nasal spray help and can safely be used as often as needed for congestion, I have prescribed: Fluticasone nasal spray two sprays in each nostril once a day  Some authorities believe that zinc sprays or the use of Echinacea may shorten the course of your symptoms.  Sinus infections are not as easily transmitted as other respiratory infection, however we still recommend that you avoid close contact with loved ones, especially the very young and elderly.  Remember to wash your hands thoroughly throughout the day as this is the number one way to prevent the spread of infection!  If you continue to worsen, you may want to be covid tested.  Home Care:  Only take medications as instructed by your medical team.  Do not take these medications with alcohol.  A steam or ultrasonic humidifier can help congestion.  You can place a towel over your head and breathe in the steam from hot water coming from a faucet.  Avoid close contacts especially the very young and the elderly.  Cover your mouth when you cough or sneeze.  Always remember to wash your hands.  Get Help Right Away If:  You develop worsening fever or sinus pain.  You develop a severe head ache or visual changes.  Your symptoms persist after you have completed your treatment plan.  Make sure you  Understand these instructions.  Will  watch your condition.  Will get help right away if you are not doing well or get worse.  Your e-visit answers were reviewed by a board certified advanced clinical practitioner to complete your personal care plan.  Depending on the condition, your plan could have included both over the counter or prescription medications.  If there is a problem please reply  once you have received a response from your provider.  Your safety is important to [place].  If you have drug allergies check your prescription carefully.    You can use MyChart to ask questions about today's visit, request a non-urgent call back, or ask for a work or school excuse for 24 hours related to this e-Visit. If it has been greater than 24 hours you will need to follow up with your provider, or enter a new e-Visit to address those concerns.  You will get an e-mail in the next two days asking about your experience.  I hope that your e-visit has been valuable and will speed your recovery. Thank you for using e-visits.   5-10 minutes spent reviewing and documenting in chart.

## 2019-03-18 ENCOUNTER — Encounter: Payer: Self-pay | Admitting: Family Medicine

## 2019-03-24 ENCOUNTER — Other Ambulatory Visit (INDEPENDENT_AMBULATORY_CARE_PROVIDER_SITE_OTHER): Payer: BC Managed Care – PPO

## 2019-03-24 ENCOUNTER — Other Ambulatory Visit: Payer: Self-pay

## 2019-03-24 DIAGNOSIS — Z23 Encounter for immunization: Secondary | ICD-10-CM

## 2019-04-09 ENCOUNTER — Ambulatory Visit: Payer: BC Managed Care – PPO | Attending: Internal Medicine

## 2019-04-09 ENCOUNTER — Other Ambulatory Visit: Payer: Self-pay

## 2019-04-09 DIAGNOSIS — Z20822 Contact with and (suspected) exposure to covid-19: Secondary | ICD-10-CM

## 2019-04-10 LAB — NOVEL CORONAVIRUS, NAA: SARS-CoV-2, NAA: NOT DETECTED

## 2019-04-20 ENCOUNTER — Encounter: Payer: Self-pay | Admitting: Family Medicine

## 2019-04-20 ENCOUNTER — Ambulatory Visit: Payer: BC Managed Care – PPO | Admitting: Family Medicine

## 2019-04-20 ENCOUNTER — Other Ambulatory Visit: Payer: Self-pay

## 2019-04-20 VITALS — BP 132/84 | Temp 98.1°F | Wt 193.0 lb

## 2019-04-20 DIAGNOSIS — H539 Unspecified visual disturbance: Secondary | ICD-10-CM | POA: Diagnosis not present

## 2019-04-20 DIAGNOSIS — F411 Generalized anxiety disorder: Secondary | ICD-10-CM | POA: Diagnosis not present

## 2019-04-20 MED ORDER — ESCITALOPRAM OXALATE 10 MG PO TABS: 10.0000 mg | ORAL_TABLET | Freq: Every day | ORAL | 4 refills | Status: DC

## 2019-04-20 NOTE — Telephone Encounter (Signed)
Called and scheduled pt appt for today

## 2019-04-20 NOTE — Progress Notes (Signed)
   Subjective:    Patient ID: Suzanne Hart, female    DOB: [birth year], 54 y.o.   MRN: [id number]  HPI  This patient has a history of migraines She relates that her right eye feels like there is a haze over the right eye.  Her focus seems a little off to her She denies any painful eye she denies redness denies drainage.  Denies double vision.  Denies loss of vision. Pt here today due to having a haze over right eye that started on Saturday. Pt has also had a dull headache since Saturday. Pt took some Advil but that did not help   Patient states stress levels are okay.  Denies having this issue before.  She does see eye doctor on a regular basis for her glasses. She denies any recent headaches other than occasional migraines for which she gets Denies waking up with a headache in the middle of the night    Review of Systems  Constitutional: Negative for activity change and appetite change.  HENT: Negative for congestion and rhinorrhea.   Respiratory: Negative for cough and shortness of breath.   Cardiovascular: Negative for chest pain and leg swelling.  Gastrointestinal: Negative for abdominal pain, nausea and vomiting.  Skin: Negative for color change.  Neurological: Negative for dizziness and weakness.  Psychiatric/Behavioral: Negative for agitation and confusion.  See above     Objective:   Physical Exam Vitals reviewed.  Constitutional:      General: She is not in acute distress. HENT:     Head: Normocephalic and atraumatic.  Eyes:     General:        Right eye: No discharge.        Left eye: No discharge.  Neck:     Trachea: No tracheal deviation.  Cardiovascular:     Rate and Rhythm: Normal rate and regular rhythm.     Heart sounds: Normal heart sounds. No murmur.  Pulmonary:     Effort: Pulmonary effort is normal. No respiratory distress.     Breath sounds: Normal breath sounds.  Lymphadenopathy:     Cervical: No cervical adenopathy.  Skin:    General: Skin is  warm and dry.  Neurological:     Mental Status: She is alert.     Coordination: Coordination normal.  Psychiatric:        Behavior: Behavior normal.    Pupils responsive to light The best I could tell retina had equal colors it was difficult to see the optic disc EOMI Peripheral vision good Snellen good       Assessment & Plan:  Subjective haze on the right eye I recommend that she get back in with her optometry with [facility] in [place]. She will need to have them look at her retina and optic discs to make sure there is no sign of any vascular issue. I doubt any type of retinal artery occlusion.  The likelihood of a detached retina is low.  If her symptoms persist despite normal eye exam from the eye doctors then the next step would be a neurologic work-up Patient will keep [place] updated  Finally GAD-did not tolerate Zoloft because of restless legs Discussed various options We will try Lexapro 10 mg daily If she has significant side effects with this we can try BuSpar patient will keep [place] updated with update within the next few weeks Follow-up within 6 months

## 2019-06-11 ENCOUNTER — Encounter: Payer: Self-pay | Admitting: Family Medicine

## 2019-06-16 MED ORDER — FLUOXETINE HCL 10 MG PO CAPS: ORAL_CAPSULE | ORAL | 3 refills | Status: DC

## 2019-06-16 NOTE — Addendum Note (Signed)
Addended by: Vicente Males on: 06/16/2019 01:57 PM   Modules accepted: Orders

## 2019-06-16 NOTE — Telephone Encounter (Signed)
Nurses Please connect with patient Given the circumstances and her recent office visit and diagnosis I believe it is reasonable to try low-dose Prozac 10 mg 1 daily, #30, 3 refills I would like to do a follow-up visit within 6 weeks either virtual or in person Patient can notify us if any side effects Generally well-tolerated medication May have some nausea the first week If it to starts causing palpitations we would need to stop the medicine and the patient would need to notify us.  The likelihood of this happening is low but possible

## 2019-06-17 ENCOUNTER — Encounter: Payer: Self-pay | Admitting: Family Medicine

## 2019-06-17 ENCOUNTER — Ambulatory Visit: Payer: BC Managed Care – PPO | Admitting: Family Medicine

## 2019-06-17 ENCOUNTER — Other Ambulatory Visit: Payer: Self-pay

## 2019-06-17 VITALS — BP 114/68 | Temp 97.6°F | Wt 195.6 lb

## 2019-06-17 DIAGNOSIS — F411 Generalized anxiety disorder: Secondary | ICD-10-CM | POA: Diagnosis not present

## 2019-06-17 DIAGNOSIS — H938X2 Other specified disorders of left ear: Secondary | ICD-10-CM

## 2019-06-17 NOTE — Progress Notes (Signed)
   Subjective:    Patient ID: Suzanne Hart, female    DOB: 02-06-1966, 54 y.o.   MRN: [id number]  HPI Patient comes in today for ear and neck pain/pressure that started Friday. Patient states advil has helped. statrted Just pressure No pain Felt bad Fatigued No chills fever No uri sx No balance issues advil helped No nausea No headache No blurred vision Appetite ok Patient has also not been sleeping well but not sure if its related or not.    Review of Systems     Objective:   Physical Exam Vitals reviewed.  Constitutional:      General: She is not in acute distress. HENT:     Head: Normocephalic and atraumatic.  Eyes:     General:        Right eye: No discharge.        Left eye: No discharge.  Neck:     Trachea: No tracheal deviation.  Cardiovascular:     Rate and Rhythm: Normal rate and regular rhythm.     Heart sounds: Normal heart sounds. No murmur.  Pulmonary:     Effort: Pulmonary effort is normal. No respiratory distress.     Breath sounds: Normal breath sounds.  Lymphadenopathy:     Cervical: No cervical adenopathy.  Skin:    General: Skin is warm and dry.  Neurological:     Mental Status: She is alert.     Coordination: Coordination normal.  Psychiatric:        Behavior: Behavior normal.   Minimal cervical tenderness on the left side Some dental caries noted Finger-to-nose normal Romberg negative no nystagmus Cranial nerves normal       Assessment & Plan:  Patient follows up with her dentist Patient symptoms are improving I find no evidence of vertigo no need for medication currently warning signs were discussed no sign of stroke Keep regular follow-up  Should be noted that patient recently did not tolerate sertraline but will start Prozac for underlying stress related issues she will give [place] follow-up within 4 weeks

## 2019-10-16 ENCOUNTER — Encounter: Payer: Self-pay | Admitting: Family Medicine

## 2019-10-16 ENCOUNTER — Other Ambulatory Visit: Payer: Self-pay | Admitting: *Deleted

## 2019-10-16 DIAGNOSIS — L0292 Furuncle, unspecified: Secondary | ICD-10-CM

## 2019-10-16 MED ORDER — DOXYCYCLINE HYCLATE 100 MG PO TABS: 100.0000 mg | ORAL_TABLET | Freq: Two times a day (BID) | ORAL | 0 refills | Status: DC

## 2019-10-16 NOTE — Telephone Encounter (Signed)
Given the extenuating circumstances and given that we are not open tomorrow  May have doxycycline 100 mg 1 twice daily for 7 days  Hi Evann Given the circumstances we will send in the medication In the future we can do a virtual visit-upon request-when you are unable to come to the office-which allows [place] more thorough documentation Thank you for your understanding Dr. Nicki Reaper   Should you have ongoing troubles office visit would be necessary or urgent care or ER

## 2019-12-04 ENCOUNTER — Encounter: Payer: Self-pay | Admitting: Family Medicine

## 2019-12-04 ENCOUNTER — Other Ambulatory Visit: Payer: Self-pay

## 2019-12-04 ENCOUNTER — Ambulatory Visit (INDEPENDENT_AMBULATORY_CARE_PROVIDER_SITE_OTHER): Payer: BC Managed Care – PPO | Admitting: Family Medicine

## 2019-12-04 DIAGNOSIS — R05 Cough: Secondary | ICD-10-CM

## 2019-12-04 DIAGNOSIS — R059 Cough, unspecified: Secondary | ICD-10-CM

## 2019-12-04 MED ORDER — ONDANSETRON 8 MG PO TBDP: 8.0000 mg | ORAL_TABLET | Freq: Three times a day (TID) | ORAL | 0 refills | Status: DC | PRN

## 2019-12-04 NOTE — Progress Notes (Signed)
   Subjective:    Patient ID: Suzanne Hart, female    DOB: 1965-06-22, 54 y.o.   MRN: [id number]  HPI Pt woke up in the middle of night with cough, headache, stuffiness and nausea.  Patient with body aches headache coughing nausea not feeling good low energy.  Some sweats.  No wheezing or difficulty breathing.  PMH benign Review of Systems Please see above    Objective:   Physical Exam  Lungs clear respiratory rate normal heart regular HEENT benign skin warm dry O2 saturation 98%      Assessment & Plan:  Possible Covid infection Covid test taken Self-isolation recommended Warning signs what to watch for were reviewed in detail No need for x-rays lab work or ER at this point

## 2019-12-07 LAB — NOVEL CORONAVIRUS, NAA: SARS-CoV-2, NAA: NOT DETECTED

## 2019-12-22 ENCOUNTER — Other Ambulatory Visit: Payer: Self-pay | Admitting: Family Medicine

## 2019-12-22 NOTE — Telephone Encounter (Signed)
May have 42-month supply of Prozac may have 1 refill of Lomotil needs follow-up office visit before December

## 2019-12-23 ENCOUNTER — Encounter: Payer: Self-pay | Admitting: Family Medicine

## 2019-12-23 ENCOUNTER — Other Ambulatory Visit: Payer: Self-pay | Admitting: Family Medicine

## 2019-12-23 MED ORDER — DIPHENOXYLATE-ATROPINE 2.5-0.025 MG PO TABS: ORAL_TABLET | ORAL | 0 refills | Status: DC

## 2020-02-25 ENCOUNTER — Ambulatory Visit
Admission: EM | Admit: 2020-02-25 | Discharge: 2020-02-25 | Disposition: A | Discharge status: Home / Self Care | Payer: BC Managed Care – PPO | Attending: Emergency Medicine | Admitting: Emergency Medicine

## 2020-02-25 ENCOUNTER — Other Ambulatory Visit: Payer: Self-pay

## 2020-02-25 ENCOUNTER — Ambulatory Visit: Payer: Self-pay

## 2020-02-25 DIAGNOSIS — M5412 Radiculopathy, cervical region: Secondary | ICD-10-CM

## 2020-02-25 MED ORDER — GABAPENTIN 100 MG PO CAPS: 100.0000 mg | ORAL_CAPSULE | Freq: Three times a day (TID) | ORAL | 0 refills | Status: DC

## 2020-02-25 MED ORDER — PREDNISONE 10 MG PO TABS: 20.0000 mg | ORAL_TABLET | Freq: Every day | ORAL | 0 refills | Status: DC

## 2020-02-25 NOTE — ED Triage Notes (Signed)
Pt presents with complaints of tightness in her upper back and neck that started a week ago. Patient denies any recent injury or activity to cause the pain. Pt is also having pain and numbness in her arms and a headache with it as well. Pt was diagnosed with arthritis in her neck x 4 years ago.

## 2020-02-25 NOTE — Discharge Instructions (Addendum)
Take gabapentin as prescribed Prednisone was prescribed/take as directed Follow-up with PCP Return or go to ED if you develop any new or worsening of symptoms

## 2020-02-25 NOTE — ED Provider Notes (Signed)
[place]   [id number] 02/25/20 Arrival Time: 30   Chief Complaint  Patient presents with  . Appointment    1p  . Headache     SUBJECTIVE: History from: patient.  Suzanne Hart is a 54 y.o. female who presented to the urgent care for complaint of neck and upper back tightness, headache, and tingling numbness of finger for the past week.  Denies any precipitating event.  Localized numbness and tingling to bilateral hand.  Denies recent travel.  Has tried OTC medication without relief.  Denies alleviating or aggravating factors.  Denies similar symptoms in the past.   Denies fever, chills, fatigue, sinus pain, LOC, confusion.  ROS: As per HPI.  All other pertinent ROS negative.     Past Medical History:  Diagnosis Date  . Chronic headache   . Chronic joint pain   . GERD (gastroesophageal reflux disease)   . H/O migraine   . Iron deficiency anemia 2004   Secondary menorrhagia   Past Surgical History:  Procedure Laterality Date  . ABDOMINAL HYSTERECTOMY     2005  . COLONOSCOPY N/A 01/16/2013   Dr. Fields:Normal mucosa in the terminal ileum/Five COLON POLYPS REMOVED/RECTAL BLEEDING DUE TO Moderate sized internal hemorrhoids. hyperplastic polyps  . ESOPHAGOGASTRODUODENOSCOPY N/A 01/16/2013   Dr. Gillie Manners DUE TO MILD GASTRITIS/DUODENITIS AND POSSIBLY GERD/small HH  . TUBAL LIGATION     Allergies  Allergen Reactions  . Zoloft [Sertraline]     Restless legs could not sleep   No current facility-administered medications on file prior to encounter.   Current Outpatient Medications on File Prior to Encounter  Medication Sig Dispense Refill  . diphenoxylate-atropine (LOMOTIL) 2.5-0.025 MG tablet TAKE TWO TABLETS FOUR TIMES DAILY AS NEEDED FOR DIARRHEA OR LOOSE STOOLS MAY CAUSE DROWSINESS 30 tablet 0  . etodolac (LODINE) 400 MG tablet One up to bid eight hrs apart prn for headaches 24 tablet 2  . FLUoxetine (PROZAC) 10 MG capsule TAKE ONE CAPSULE BY MOUTH  DAILY 30 capsule 2  . meclizine (ANTIVERT) 25 MG tablet TAKE ONE (1) TABLET THREE (3) TIMES EACHDAY AS NEEDED FOR DIZZINESS OR NAUSEA 30 tablet 1  . ondansetron (ZOFRAN ODT) 8 MG disintegrating tablet Take 1 tablet (8 mg total) by mouth every 8 (eight) hours as needed for nausea or vomiting. 20 tablet 0   Social History   Socioeconomic History  . Marital status: Married    Spouse name: Not on file  . Number of children: Not on file  . Years of education: Not on file  . Highest education level: Not on file  Occupational History  . Occupation: [employer]    Comment: Transportation  Tobacco Use  . Smoking status: Current Every Day Smoker    Packs/day: 15.00    Years: 0.50    Pack years: 7.50  . Smokeless tobacco: Never Used  Substance and Sexual Activity  . Alcohol use: No    Alcohol/week: 0.0 standard drinks  . Drug use: No  . Sexual activity: Not on file  Other Topics Concern  . Not on file  Social History Narrative  . Not on file   Social Determinants of Health   Financial Resource Strain: Not on file  Food Insecurity: Not on file  Transportation Needs: Not on file  Physical Activity: Not on file  Stress: Not on file  Social Connections: Not on file  Intimate Partner Violence: Not on file   Family History  Problem Relation Age of Onset  .  COPD Mother   . Mitral valve prolapse Paternal Aunt   . Congestive Heart Failure Paternal Grandfather   . Colon cancer Maternal Grandmother   . Breast cancer Maternal Aunt     OBJECTIVE:  Vitals:   02/25/20 1425  BP: 140/78  Pulse: 72  Resp: 19  Temp: 98.3 F (36.8 C)  SpO2: 98%     Physical Exam Vitals and nursing note reviewed.  Constitutional:      General: She is not in acute distress.    Appearance: Normal appearance. She is normal weight. She is not ill-appearing, toxic-appearing or diaphoretic.  HENT:     Head: Normocephalic.  Cardiovascular:     Rate and Rhythm: Normal rate and  regular rhythm.     Pulses: Normal pulses.     Heart sounds: Normal heart sounds. No murmur heard. No friction rub. No gallop.   Pulmonary:     Effort: Pulmonary effort is normal. No respiratory distress.     Breath sounds: Normal breath sounds. No stridor. No wheezing, rhonchi or rales.  Chest:     Chest wall: No tenderness.  Musculoskeletal:        General: Tenderness present.     Cervical back: Spasms and tenderness present.  Neurological:     General: No focal deficit present.     Mental Status: She is alert and oriented to person, place, and time.     GCS: GCS eye subscore is 4. GCS verbal subscore is 5. GCS motor subscore is 6.     Cranial Nerves: Cranial nerves are intact.     Sensory: Sensation is intact.     Motor: Motor function is intact.     Coordination: Coordination is intact.     Gait: Gait is intact.     LABS:  No results found for this or any previous visit (from the past 24 hour(s)).   ASSESSMENT & PLAN:  1. Cervical radiculopathy     Meds ordered this encounter  Medications  . gabapentin (NEURONTIN) 100 MG capsule    Sig: Take 1 capsule (100 mg total) by mouth 3 (three) times daily.    Dispense:  60 capsule    Refill:  0  . predniSONE (DELTASONE) 10 MG tablet    Sig: Take 2 tablets (20 mg total) by mouth daily.    Dispense:  15 tablet    Refill:  0    Discharge instructions.   Take gabapentin as prescribed Prednisone was prescribed/take as directed Follow-up with PCP Return or go to ED if you develop any new or worsening of symptoms  Reviewed expectations re: course of current medical issues. Questions answered. Outlined signs and symptoms indicating need for more acute intervention. Patient verbalized understanding. After Visit Summary given.         Emerson Monte, [place] 02/25/20 1445

## 2020-05-26 ENCOUNTER — Other Ambulatory Visit: Payer: Self-pay | Admitting: Family Medicine

## 2020-05-26 MED ORDER — ONDANSETRON 8 MG PO TBDP: 8.0000 mg | ORAL_TABLET | Freq: Three times a day (TID) | ORAL | 0 refills | Status: DC | PRN

## 2020-05-26 NOTE — Telephone Encounter (Signed)
Seen 12/04/19 for nausea

## 2020-07-18 ENCOUNTER — Ambulatory Visit: Payer: Self-pay | Admitting: Family Medicine

## 2020-07-18 ENCOUNTER — Other Ambulatory Visit: Payer: Self-pay

## 2020-07-18 ENCOUNTER — Encounter: Payer: Self-pay | Admitting: Family Medicine

## 2020-07-18 VITALS — HR 108 | Temp 98.6°F | Ht 65.0 in | Wt 199.0 lb

## 2020-07-18 DIAGNOSIS — R197 Diarrhea, unspecified: Secondary | ICD-10-CM

## 2020-07-18 NOTE — Patient Instructions (Addendum)
Eat a bland diet. Increase fluids to 64 ounces of fluid per day while having diarrhea. Continue eating yogurt while having diarrhea.     Bland Diet A bland diet consists of foods that are often soft and do not have a lot of fat, fiber, or extra seasonings. Foods without fat, fiber, or seasoning are easier for the body to digest. They are also less likely to irritate your mouth, throat, stomach, and other parts of your digestive system. A bland diet is sometimes called a BRAT diet. What is my plan? Your health care provider or food and nutrition specialist (dietitian) may recommend specific changes to your diet to prevent symptoms or to treat your symptoms. These changes may include:  Eating small meals often.  Cooking food until it is soft enough to chew easily.  Chewing your food well.  Drinking fluids slowly.  Not eating foods that are very spicy, sour, or fatty.  Not eating citrus fruits, such as oranges and grapefruit. What do I need to know about this diet?  Eat a variety of foods from the bland diet food list.  Do not follow a bland diet longer than needed.  Ask your health care provider whether you should take vitamins or supplements. What foods can I eat? Grains Hot cereals, such as cream of wheat. Rice. Bread, crackers, or tortillas made from refined white flour.   Vegetables Canned or cooked vegetables. Mashed or boiled potatoes. Fruits Bananas. Applesauce. Other types of cooked or canned fruit with the skin and seeds removed, such as canned peaches or pears.   Meats and other proteins Scrambled eggs. Creamy peanut butter or other nut butters. Lean, well-cooked meats, such as chicken or fish. Tofu. Soups or broths.   Dairy Low-fat dairy products, such as milk, cottage cheese, or yogurt. Beverages Water. Herbal tea. Apple juice.   Fats and oils Mild salad dressings. Canola or olive oil. Sweets and desserts Pudding. Custard. Fruit gelatin. Ice cream. The items  listed above may not be a complete list of recommended foods and beverages. Contact a dietitian for more options. What foods are not recommended? Grains Whole grain breads and cereals. Vegetables Raw vegetables. Fruits Raw fruits, especially citrus, berries, or dried fruits. Dairy Whole fat dairy foods. Beverages Caffeinated drinks. Alcohol. Seasonings and condiments Strongly flavored seasonings or condiments. Hot sauce. Salsa. Other foods Spicy foods. Fried foods. Sour foods, such as pickled or fermented foods. Foods with high sugar content. Foods high in fiber. The items listed above may not be a complete list of foods and beverages to avoid. Contact a dietitian for more information. Summary  A bland diet consists of foods that are often soft and do not have a lot of fat, fiber, or extra seasonings.  Foods without fat, fiber, or seasoning are easier for the body to digest.  Check with your health care provider to see how long you should follow this diet plan. It is not meant to be followed for long periods. This information is not intended to replace advice given to you by your health care provider. Make sure you discuss any questions you have with your health care provider. Document Revised: 04/03/2017 Document Reviewed: 04/03/2017 Elsevier Patient Education  2021 [employer].      Diarrhea, Adult Diarrhea is when you pass loose and watery poop (stool) often. Diarrhea can make you feel weak and cause you to lose water in your body (get dehydrated). Losing water in your body can cause you to:  Feel tired  and thirsty.  Have a dry mouth.  Go pee (urinate) less often. Diarrhea often lasts 2-3 days. However, it can last longer if it is a sign of something more serious. It is important to treat your diarrhea as told by your doctor. Follow these instructions at home: Eating and drinking Follow these instructions as told by your doctor:  Take an ORS (oral rehydration  solution). This is a drink that helps you replace fluids and minerals your body lost. It is sold at pharmacies and stores.  Drink plenty of fluids, such as: ? Water. ? Ice chips. ? Diluted fruit juice. ? Low-calorie sports drinks. ? Milk, if you want.  Avoid drinking fluids that have a lot of sugar or caffeine in them.  Eat bland, easy-to-digest foods in small amounts as you are able. These foods include: ? Bananas. ? Applesauce. ? Rice. ? Low-fat (lean) meats. ? Toast. ? Crackers.  Avoid alcohol.  Avoid spicy or fatty foods.      Medicines  Take over-the-counter and prescription medicines only as told by your doctor.  If you were prescribed an antibiotic medicine, take it as told by your doctor. Do not stop using the antibiotic even if you start to feel better. General instructions  Wash your hands often using soap and water. If soap and water are not available, use a hand sanitizer. Others in your home should wash their hands as well. Hands should be washed: ? After using the toilet or changing a diaper. ? Before preparing, cooking, or serving food. ? While caring for a sick person. ? While visiting someone in a hospital.  Drink enough fluid to keep your pee (urine) pale yellow.  Rest at home while you get better.  Watch your condition for any changes.  Take a warm bath to help with any burning or pain from having diarrhea.  Keep all follow-up visits as told by your doctor. This is important.   Contact a doctor if:  You have a fever.  Your diarrhea gets worse.  You have new symptoms.  You cannot keep fluids down.  You feel light-headed or dizzy.  You have a headache.  You have muscle cramps. Get help right away if:  You have chest pain.  You feel very weak or you pass out (faint).  You have bloody or black poop or poop that looks like tar.  You have very bad pain, cramping, or bloating in your belly (abdomen).  You have trouble breathing or you  are breathing very quickly.  Your heart is beating very quickly.  Your skin feels cold and clammy.  You feel confused.  You have signs of losing too much water in your body, such as: ? Dark pee, very little pee, or no pee. ? Cracked lips. ? Dry mouth. ? Sunken eyes. ? Sleepiness. ? Weakness. Summary  Diarrhea is when you pass loose and watery poop (stool) often.  Diarrhea can make you feel weak and cause you to lose water in your body (get dehydrated).  Take an ORS (oral rehydration solution). This is a drink that is sold at pharmacies and stores.  Eat bland, easy-to-digest foods in small amounts as you are able.  Contact a doctor if your condition gets worse. Get help right away if you have signs that you have lost too much water in your body. This information is not intended to replace advice given to you by your health care provider. Make sure you discuss any questions you have with  your health care provider. Document Revised: 08/09/2017 Document Reviewed: 08/09/2017 Elsevier Patient Education  2021 [employer].

## 2020-07-18 NOTE — Progress Notes (Signed)
Patient ID: Suzanne Hart, female    DOB: 08-26-1965, 54 y.o.   MRN: [id number]   Chief Complaint  Patient presents with  . Diarrhea    Watery x 5 days    Subjective:  CC: diarrhea for 5 days (3-4 times per day)  This is a new problem.  Presents today for an acute visit with a complaint of watery diarrhea.  Symptoms have been present for 5 days, reports that she is having diarrhea 3-4 times per day worse after eating.  Reports that this is better this week.  Has been following a bland diet, trying to keep up with her fluids.  Denies fever, chills, chest pain, shortness of breath.  Reports that there is some abdominal fullness with the diarrhea.  No abdominal pain per se.  Has not taken anything for his symptoms.    Medical History Suzanne Hart has a past medical history of Chronic headache, Chronic joint pain, GERD (gastroesophageal reflux disease), H/O migraine, and Iron deficiency anemia (2004).   Outpatient Encounter Medications as of 07/18/2020  Medication Sig  . etodolac (LODINE) 400 MG tablet One up to bid eight hrs apart prn for headaches  . FLUoxetine (PROZAC) 10 MG capsule TAKE ONE CAPSULE BY MOUTH DAILY  . ondansetron (ZOFRAN ODT) 8 MG disintegrating tablet Take 1 tablet (8 mg total) by mouth every 8 (eight) hours as needed for nausea or vomiting.  . [DISCONTINUED] diphenoxylate-atropine (LOMOTIL) 2.5-0.025 MG tablet TAKE TWO TABLETS FOUR TIMES DAILY AS NEEDED FOR DIARRHEA OR LOOSE STOOLS MAY CAUSE DROWSINESS  . [DISCONTINUED] gabapentin (NEURONTIN) 100 MG capsule Take 1 capsule (100 mg total) by mouth 3 (three) times daily.  . [DISCONTINUED] meclizine (ANTIVERT) 25 MG tablet TAKE ONE (1) TABLET THREE (3) TIMES EACHDAY AS NEEDED FOR DIZZINESS OR NAUSEA  . [DISCONTINUED] predniSONE (DELTASONE) 10 MG tablet Take 2 tablets (20 mg total) by mouth daily.   No facility-administered encounter medications on file as of 07/18/2020.     Review of Systems  Constitutional: Negative for chills  and fever.  Respiratory: Negative for shortness of breath.   Cardiovascular: Negative for chest pain.  Gastrointestinal: Positive for abdominal pain and diarrhea. Negative for nausea and vomiting.       Feels fullness  Neurological: Negative for headaches.     Vitals Pulse (!) 108   Temp 98.6 F (37 C)   Ht 5\' 5"  (1.651 m)   Wt 199 lb (90.3 kg)   SpO2 95%   BMI 33.12 kg/m   Objective:   Physical Exam Vitals and nursing note reviewed.  Constitutional:      Appearance: Normal appearance. She is not ill-appearing or toxic-appearing.  Cardiovascular:     Rate and Rhythm: Normal rate and regular rhythm.     Heart sounds: Normal heart sounds.  Pulmonary:     Effort: Pulmonary effort is normal.     Breath sounds: Normal breath sounds.  Abdominal:     General: Bowel sounds are normal.     Tenderness: There is no abdominal tenderness. There is no guarding.  Skin:    General: Skin is warm and dry.  Neurological:     General: No focal deficit present.     Mental Status: She is alert.  Psychiatric:        Behavior: Behavior normal.      Assessment and Plan   1. Diarrhea of presumed infectious origin   Reports the symptoms have improved this week, she will continue eating a bland diet,  increase her fluids to 64 ounces per day and continue adding yogurt while she is having diarrhea.  No abdominal tenderness/pain noted upon assessment today  Agrees with plan of care discussed today. Understands warning signs to seek further care: chest pain, shortness of breath, any significant change in health.  Understands to follow-up if symptoms do not continue to improve, or worsen.  Increase fluids to avoid dehydration.   Pecolia Ades, NP 07/18/2020

## 2020-10-06 ENCOUNTER — Ambulatory Visit
Admission: RE | Admit: 2020-10-06 | Discharge: 2020-10-06 | Disposition: A | Discharge status: Home / Self Care | Payer: BC Managed Care – PPO | Source: Ambulatory Visit | Attending: Emergency Medicine | Admitting: Emergency Medicine

## 2020-10-06 ENCOUNTER — Other Ambulatory Visit: Payer: Self-pay

## 2020-10-06 VITALS — BP 132/88 | HR 93 | Temp 97.7°F | Resp 16

## 2020-10-06 DIAGNOSIS — R0981 Nasal congestion: Secondary | ICD-10-CM

## 2020-10-06 MED ORDER — AMOXICILLIN-POT CLAVULANATE 875-125 MG PO TABS: 1.0000 | ORAL_TABLET | Freq: Two times a day (BID) | ORAL | 0 refills | Status: AC

## 2020-10-06 NOTE — ED Provider Notes (Signed)
[place]   [id number] 10/06/20 Arrival Time: 3785   CC: sinus congestion  SUBJECTIVE: History from: patient.  Suzanne BARNER is a 55 y.o. female who presents with sinus congestion, sinus pain/ pressure x 6 days  Denies sick exposure to COVID, flu or strep.  Denies alleviating or aggravating factors.  Reports previous symptoms in the past.   Denies fever, chills, SOB, wheezing, chest pain, nausea, changes in bowel or bladder habits.    ROS: As per HPI.  All other pertinent ROS negative.     Past Medical History:  Diagnosis Date   Chronic headache    Chronic joint pain    GERD (gastroesophageal reflux disease)    H/O migraine    Iron deficiency anemia 2004   Secondary menorrhagia   Past Surgical History:  Procedure Laterality Date   ABDOMINAL HYSTERECTOMY     2005   COLONOSCOPY N/A 01/16/2013   Dr. Fields:Normal mucosa in the terminal ileum/Five COLON POLYPS REMOVED/RECTAL BLEEDING DUE TO Moderate sized internal hemorrhoids. hyperplastic polyps   ESOPHAGOGASTRODUODENOSCOPY N/A 01/16/2013   Dr. Gillie Manners DUE TO MILD GASTRITIS/DUODENITIS AND POSSIBLY GERD/small HH   TUBAL LIGATION     Allergies  Allergen Reactions   Zoloft [Sertraline]     Restless legs could not sleep   No current facility-administered medications on file prior to encounter.   Current Outpatient Medications on File Prior to Encounter  Medication Sig Dispense Refill   etodolac (LODINE) 400 MG tablet One up to bid eight hrs apart prn for headaches 24 tablet 2   FLUoxetine (PROZAC) 10 MG capsule TAKE ONE CAPSULE BY MOUTH DAILY 30 capsule 2   Social History   Socioeconomic History   Marital status: Married    Spouse name: Not on file   Number of children: Not on file   Years of education: Not on file   Highest education level: Not on file  Occupational History   Occupation: [employer]    Comment: Transportation  Tobacco Use   Smoking status: Every Day     Packs/day: 15.00    Years: 0.50    Pack years: 7.50    Types: Cigarettes   Smokeless tobacco: Never  Substance and Sexual Activity   Alcohol use: No    Alcohol/week: 0.0 standard drinks   Drug use: No   Sexual activity: Not on file  Other Topics Concern   Not on file  Social History Narrative   Not on file   Social Determinants of Health   Financial Resource Strain: Not on file  Food Insecurity: Not on file  Transportation Needs: Not on file  Physical Activity: Not on file  Stress: Not on file  Social Connections: Not on file  Intimate Partner Violence: Not on file   Family History  Problem Relation Age of Onset   COPD Mother    Mitral valve prolapse Paternal Aunt    Congestive Heart Failure Paternal Grandfather    Colon cancer Maternal Grandmother    Breast cancer Maternal Aunt     OBJECTIVE:  Vitals:   10/06/20 0907  BP: 132/88  Pulse: 93  Resp: 16  Temp: 97.7 F (36.5 C)  TempSrc: Tympanic  SpO2: 96%     General appearance: alert; appears fatigued, but nontoxic; speaking in full sentences and tolerating own secretions HEENT: NCAT; Ears: EACs clear, TMs pearly gray; Eyes: PERRL.  EOM grossly intact. Nose: nares patent without rhinorrhea, Throat: oropharynx clear, tonsils non erythematous or enlarged, uvula midline  Neck: supple without LAD Lungs: unlabored respirations, symmetrical air entry; cough: absent; no respiratory distress; CTAB Heart: regular rate and rhythm.   Skin: warm and dry Psychological: alert and cooperative; normal mood and affect  ASSESSMENT & PLAN:  1. Sinus congestion     Meds ordered this encounter  Medications   amoxicillin-clavulanate (AUGMENTIN) 875-125 MG tablet    Sig: Take 1 tablet by mouth every 12 (twelve) hours for 10 days.    Dispense:  20 tablet    Refill:  0    Order Specific Question:   Supervising Provider    Answer:   Raylene Everts [[id number]]   Get plenty of rest and push fluids Augmentin prescribed.    Use OTC zyrtec for nasal congestion, runny nose, and/or sore throat Use OTC flonase for nasal congestion and runny nose Use medications daily for symptom relief Use OTC medications like ibuprofen or tylenol as needed fever or pain Call or go to the ED if you have any new or worsening symptoms such as fever, cough, shortness of breath, chest tightness, chest pain, turning blue, changes in mental status, etc...   Reviewed expectations re: course of current medical issues. Questions answered. Outlined signs and symptoms indicating need for more acute intervention. Patient verbalized understanding. After Visit Summary given.          Lestine Box, PA-C 10/06/20 [phone]

## 2020-10-06 NOTE — Discharge Instructions (Addendum)
Get plenty of rest and push fluids Augmentin prescribed.   Use OTC zyrtec for nasal congestion, runny nose, and/or sore throat Use OTC flonase for nasal congestion and runny nose Use medications daily for symptom relief Use OTC medications like ibuprofen or tylenol as needed fever or pain Call or go to the ED if you have any new or worsening symptoms such as fever, cough, shortness of breath, chest tightness, chest pain, turning blue, changes in mental status, etc..Marland Kitchen

## 2020-10-06 NOTE — ED Triage Notes (Signed)
Scratchy throat that started on Friday. Congestion started Monday morning.  Neg at home covid test

## 2020-10-21 ENCOUNTER — Encounter: Payer: Self-pay | Admitting: Nurse Practitioner

## 2020-10-21 ENCOUNTER — Other Ambulatory Visit: Payer: Self-pay

## 2020-10-21 ENCOUNTER — Ambulatory Visit (INDEPENDENT_AMBULATORY_CARE_PROVIDER_SITE_OTHER): Payer: BC Managed Care – PPO | Admitting: Nurse Practitioner

## 2020-10-21 VITALS — BP 120/68 | HR 87 | Temp 98.2°F | Ht 65.0 in | Wt 202.0 lb

## 2020-10-21 DIAGNOSIS — R232 Flushing: Secondary | ICD-10-CM | POA: Diagnosis not present

## 2020-10-21 DIAGNOSIS — Z13220 Encounter for screening for lipoid disorders: Secondary | ICD-10-CM

## 2020-10-21 DIAGNOSIS — F411 Generalized anxiety disorder: Secondary | ICD-10-CM

## 2020-10-21 DIAGNOSIS — R5383 Other fatigue: Secondary | ICD-10-CM

## 2020-10-21 DIAGNOSIS — R0683 Snoring: Secondary | ICD-10-CM | POA: Diagnosis not present

## 2020-10-21 MED ORDER — FLUOXETINE HCL 20 MG PO TABS: 20.0000 mg | ORAL_TABLET | Freq: Every day | ORAL | 0 refills | Status: DC

## 2020-10-21 NOTE — Progress Notes (Signed)
Subjective:    Patient ID: Suzanne Hart, female    DOB: [birth year], 55 y.o.   MRN: [id number]  HPI  Fatigue- Hot flashes, irritable for years but worsened lately - affecting energy level at the end of the day.  Patient presents with fatigue irritability hot flashes that have worsened.  Patient states fatigue began about 6 months ago and notices the fatigue when she gets home from work.  Patient has not been sleeping well and states that she wakes up every 2 hours with mind racing for the past month.  Patient job is stressful especially at this time of year and says she has been experiencing hot flashes for the last month which does not help with the stress. denies any dizziness or shortness of breath, denies chest pain nor swelling in upper and lower extremities. PSH: Hysterectomy No BSO Positive [place] questionnaire. Review of Systems  Constitutional:  Positive for fatigue. Negative for activity change, appetite change and fever.  HENT:  Negative for sore throat and trouble swallowing.   Respiratory:  Negative for cough, chest tightness, shortness of breath and wheezing.   Cardiovascular: Negative.   Gastrointestinal:  Negative for abdominal pain, constipation, diarrhea, nausea and vomiting.  Depression screen [facility] 2/9 10/21/2020 06/20/2016 05/22/2016  Decreased Interest 0 1 0  Down, Depressed, Hopeless 0 1 0  PHQ - 2 Score 0 2 0   GAD 7 : Generalized Anxiety Score 10/21/2020  Nervous, Anxious, on Edge 3  Control/stop worrying 1  Worry too much - different things 1  Trouble relaxing 3  Restless 1  Easily annoyed or irritable 3  Afraid - awful might happen 0  Total GAD 7 Score 12  Anxiety Difficulty Somewhat difficult        Objective:   Physical Exam Constitutional:      General: She is not in acute distress. Neck:     Comments: Thyroid nontender to palpation, no mass or goiter noted. Cardiovascular:     Rate and Rhythm: Normal rate and regular rhythm.     Heart sounds: Normal  heart sounds.  Pulmonary:     Effort: Pulmonary effort is normal.     Breath sounds: Normal breath sounds.  Abdominal:     General: Bowel sounds are normal.     Palpations: Abdomen is soft.     Tenderness: There is no abdominal tenderness.  Musculoskeletal:     Cervical back: Normal range of motion and neck supple.     Right lower leg: No edema.     Left lower leg: No edema.  Lymphadenopathy:     Cervical: No cervical adenopathy.  Neurological:     Mental Status: She is alert.  Psychiatric:        Mood and Affect: Mood normal.        Behavior: Behavior normal.        Thought Content: Thought content normal.        Judgment: Judgment normal.     Today's Vitals   10/21/20 1114  BP: 120/68  Pulse: 87  Temp: 98.2 F (36.8 C)  SpO2: 96%  Weight: 202 lb (91.6 kg)  Height: '5\' 5"'$  (1.651 m)   Body mass index is 33.61 kg/m.      Assessment & Plan:   Problem List Items Addressed This Visit       Other   Anxiety, generalized   Relevant Medications   FLUoxetine (PROZAC) 20 MG tablet   Other Visit Diagnoses  Fatigue, unspecified type    -  Primary   Relevant Orders   CBC with Differential/Platelet   Comprehensive metabolic panel   Lipid panel   TSH   Home sleep test   Screening for cholesterol level       Relevant Orders   CBC with Differential/Platelet   Comprehensive metabolic panel   Lipid panel   TSH   Hot flashes       Relevant Orders   CBC with Differential/Platelet   Comprehensive metabolic panel   Lipid panel   TSH   Snoring       Relevant Orders   Home sleep test      Meds ordered this encounter  Medications   FLUoxetine (PROZAC) 20 MG tablet    Sig: Take 1 tablet (20 mg total) by mouth daily.    Dispense:  90 tablet    Refill:  0    Order Specific Question:   Supervising Provider    Answer:   Sallee Lange A [[id number]]  Increase Prozac to 20 mg/day if patient has problems with the new dosing she will go back to 10 mg/day and contact the  office. Home-based sleep test ordered. Discussed diet and encouraged physical activity.   Discussed smoking cessation, defers medication at this time. Labs pending. Return in 3 months (on 01/21/2021). Sooner depending on lab results.

## 2020-10-27 LAB — CBC WITH DIFFERENTIAL/PLATELET
Basophils Absolute: 0.1 10*3/uL (ref 0.0–0.2)
Basos: 1 %
EOS (ABSOLUTE): 0.3 10*3/uL (ref 0.0–0.4)
Eos: 4 %
Hematocrit: 42.3 % (ref 34.0–46.6)
Hemoglobin: 13.9 g/dL (ref 11.1–15.9)
Immature Grans (Abs): 0 10*3/uL (ref 0.0–0.1)
Immature Granulocytes: 0 %
Lymphocytes Absolute: 2.5 10*3/uL (ref 0.7–3.1)
Lymphs: 33 %
MCH: 28.8 pg (ref 26.6–33.0)
MCHC: 32.9 g/dL (ref 31.5–35.7)
MCV: 88 fL (ref 79–97)
Monocytes Absolute: 0.5 10*3/uL (ref 0.1–0.9)
Monocytes: 7 %
Neutrophils Absolute: 4.2 10*3/uL (ref 1.4–7.0)
Neutrophils: 55 %
Platelets: 299 10*3/uL (ref 150–450)
RBC: 4.82 x10E6/uL (ref 3.77–5.28)
RDW: 13 % (ref 11.7–15.4)
WBC: 7.5 10*3/uL (ref 3.4–10.8)

## 2020-10-27 LAB — LIPID PANEL
Chol/HDL Ratio: 5.9 ratio — ABNORMAL HIGH (ref 0.0–4.4)
Cholesterol, Total: 272 mg/dL — ABNORMAL HIGH (ref 100–199)
HDL: 46 mg/dL (ref >39)
LDL Chol Calc (NIH): 205 mg/dL — ABNORMAL HIGH (ref 0–99)
Triglycerides: 115 mg/dL (ref 0–149)
VLDL Cholesterol Cal: 21 mg/dL (ref 5–40)

## 2020-10-27 LAB — COMPREHENSIVE METABOLIC PANEL
ALT: 27 IU/L (ref 0–32)
AST: 17 IU/L (ref 0–40)
Albumin/Globulin Ratio: 1.6 (ref 1.2–2.2)
Albumin: 4.4 g/dL (ref 3.8–4.9)
Alkaline Phosphatase: 114 IU/L (ref 44–121)
BUN/Creatinine Ratio: 17 (ref 9–23)
BUN: 12 mg/dL (ref 6–24)
Bilirubin Total: 0.3 mg/dL (ref 0.0–1.2)
CO2: 25 mmol/L (ref 20–29)
Calcium: 9.4 mg/dL (ref 8.7–10.2)
Chloride: 102 mmol/L (ref 96–106)
Creatinine, Ser: 0.71 mg/dL (ref 0.57–1.00)
Globulin, Total: 2.7 g/dL (ref 1.5–4.5)
Glucose: 109 mg/dL — ABNORMAL HIGH (ref 65–99)
Potassium: 5.1 mmol/L (ref 3.5–5.2)
Sodium: 139 mmol/L (ref 134–144)
Total Protein: 7.1 g/dL (ref 6.0–8.5)
eGFR: 101 mL/min/{1.73_m2} (ref >59)

## 2020-10-27 LAB — TSH: TSH: 2.43 u[IU]/mL (ref 0.450–4.500)

## 2020-10-31 ENCOUNTER — Encounter: Payer: Self-pay | Admitting: Nurse Practitioner

## 2020-11-05 ENCOUNTER — Other Ambulatory Visit: Payer: Self-pay | Admitting: Nurse Practitioner

## 2020-11-05 DIAGNOSIS — Z79899 Other long term (current) drug therapy: Secondary | ICD-10-CM

## 2020-11-05 DIAGNOSIS — E782 Mixed hyperlipidemia: Secondary | ICD-10-CM | POA: Insufficient documentation

## 2020-11-05 DIAGNOSIS — E785 Hyperlipidemia, unspecified: Secondary | ICD-10-CM

## 2020-11-05 MED ORDER — ROSUVASTATIN CALCIUM 10 MG PO TABS: 10.0000 mg | ORAL_TABLET | Freq: Every day | ORAL | 0 refills | Status: DC

## 2020-12-07 ENCOUNTER — Other Ambulatory Visit: Payer: Self-pay | Admitting: Family Medicine

## 2020-12-07 ENCOUNTER — Encounter: Payer: Self-pay | Admitting: Family Medicine

## 2020-12-07 MED ORDER — AMOXICILLIN 500 MG PO TABS: 500.0000 mg | ORAL_TABLET | Freq: Three times a day (TID) | ORAL | 0 refills | Status: DC

## 2021-02-02 ENCOUNTER — Encounter: Payer: Self-pay | Admitting: Family Medicine

## 2021-02-02 NOTE — Telephone Encounter (Signed)
Please help set up a visit for the patient   may do a provider visit either with myself or with Docia Barrier before that date.  We can code it has tobacco cessation visit.  Obviously we will be discussing how to quit smoking and various pathways to do so.

## 2021-02-08 ENCOUNTER — Other Ambulatory Visit: Payer: Self-pay

## 2021-02-08 ENCOUNTER — Ambulatory Visit: Payer: BC Managed Care – PPO | Admitting: Nurse Practitioner

## 2021-02-08 ENCOUNTER — Encounter: Payer: Self-pay | Admitting: Nurse Practitioner

## 2021-02-08 VITALS — BP 131/71 | HR 76 | Temp 98.2°F | Ht 65.0 in | Wt 200.0 lb

## 2021-02-08 DIAGNOSIS — F17200 Nicotine dependence, unspecified, uncomplicated: Secondary | ICD-10-CM | POA: Diagnosis not present

## 2021-02-08 DIAGNOSIS — E785 Hyperlipidemia, unspecified: Secondary | ICD-10-CM

## 2021-02-08 NOTE — Progress Notes (Signed)
   Subjective:    Patient ID: Suzanne Hart, female    DOB: November 04, 1965, 55 y.o.   MRN: [id number]  HPI  Patient here for advice regarding smoking cessation. Patient has a smoking history of ~35+ years. Patient motivated to stop smoking due to increased premiums on her work insurance because she smokes. Patient was placed on Fluoxetine 20mg  for anxiety in 10/2020. As a result she has decreased her tobacco consumption to 5 cigarettes or less a day. Patient admitted to using cigarettes to relax, however, now patient states that it is just a habit that she would like to break now.    Review of Systems  Constitutional:  Negative for chills and fever.  HENT:  Negative for congestion.   Respiratory:  Negative for cough and shortness of breath.   Cardiovascular:  Negative for chest pain and palpitations.  Neurological:  Negative for headaches.      Objective:   Physical Exam Constitutional:      Appearance: Normal appearance.  Cardiovascular:     Rate and Rhythm: Normal rate and regular rhythm.     Pulses: Normal pulses.     Heart sounds: Normal heart sounds.  Pulmonary:     Effort: Pulmonary effort is normal. No respiratory distress.     Breath sounds: Normal breath sounds. No wheezing.  Skin:    General: Skin is warm.  Neurological:     General: No focal deficit present.     Mental Status: She is alert and oriented to person, place, and time.  Psychiatric:        Mood and Affect: Mood normal.        Behavior: Behavior normal.       Assessment & Plan:   1. Tobacco dependence - Patient motivated to quit smoking - Provided patient with advice to quit smoking (substituting cigarette with jolly rancher, gum, or popcorn; focusing on decreasing 1 cigarette ever week to 2 weeks; relaxation tips).  - Offered nicotine patch or oral smoking cessation pharmacological agents to assist in quitting; patient declined at this time and would like to try jolly ranchers or other technique first. -  Follow up in 3 months to assess progress.  2. Hyperlipidemia - Patient started on Rosuvastatin in 10/2020 for LDL of 205. - A1C, HFP, and Lipid Panel ordered for next visit.  - Follow up in 3 months to reassess cholesterol and labs.   3. Health Maintenance - Covid vaccine 1 done in March 2021 - Will schedule Flu Shot after Thanksgiving

## 2021-03-06 ENCOUNTER — Other Ambulatory Visit: Payer: Self-pay | Admitting: Nurse Practitioner

## 2021-05-11 ENCOUNTER — Ambulatory Visit: Payer: BC Managed Care – PPO | Admitting: Nurse Practitioner

## 2021-05-19 ENCOUNTER — Other Ambulatory Visit: Payer: Self-pay

## 2021-05-19 ENCOUNTER — Ambulatory Visit
Admission: RE | Admit: 2021-05-19 | Discharge: 2021-05-19 | Disposition: A | Discharge status: Home / Self Care | Payer: BC Managed Care – PPO | Source: Ambulatory Visit | Attending: Urgent Care | Admitting: Urgent Care

## 2021-05-19 VITALS — BP 144/92 | HR 72 | Temp 98.5°F | Resp 18

## 2021-05-19 DIAGNOSIS — J209 Acute bronchitis, unspecified: Secondary | ICD-10-CM | POA: Diagnosis not present

## 2021-05-19 DIAGNOSIS — F17200 Nicotine dependence, unspecified, uncomplicated: Secondary | ICD-10-CM | POA: Diagnosis not present

## 2021-05-19 DIAGNOSIS — R062 Wheezing: Secondary | ICD-10-CM

## 2021-05-19 DIAGNOSIS — R0989 Other specified symptoms and signs involving the circulatory and respiratory systems: Secondary | ICD-10-CM | POA: Diagnosis not present

## 2021-05-19 MED ORDER — BENZONATATE 100 MG PO CAPS: 100.0000 mg | ORAL_CAPSULE | Freq: Three times a day (TID) | ORAL | 0 refills | Status: DC | PRN

## 2021-05-19 MED ORDER — PROMETHAZINE-DM 6.25-15 MG/5ML PO SYRP: 5.0000 mL | ORAL_SOLUTION | Freq: Every evening | ORAL | 0 refills | Status: DC | PRN

## 2021-05-19 MED ORDER — ALBUTEROL SULFATE HFA 108 (90 BASE) MCG/ACT IN AERS: 1.0000 | INHALATION_SPRAY | Freq: Four times a day (QID) | RESPIRATORY_TRACT | 0 refills | Status: DC | PRN

## 2021-05-19 MED ORDER — PREDNISONE 50 MG PO TABS: 50.0000 mg | ORAL_TABLET | Freq: Every day | ORAL | 0 refills | Status: DC

## 2021-05-19 NOTE — ED Provider Notes (Signed)
?[place] ? ? ?MRN: [id number] DOB: 12-16-1965 ? ?Subjective:  ? ?Suzanne Hart is a 56 y.o. female presenting for 4-day history of acute onset persistent coughing, chest congestion and chest tightness.  She is also had sinus congestion.  She is working on quitting smoking, and is down to less than 1/4 pack/day.  Has been using over-the-counter medications with minimal relief.  She did do a COVID test and was negative.  Does not want a repeat.  No chest pain, shortness of breath or wheezing. ? ?No current facility-administered medications for this encounter. ? ?Current Outpatient Medications:  ?  FLUoxetine (PROZAC) 20 MG capsule, Take 1 capsule (20 mg total) by mouth daily., Disp: 90 capsule, Rfl: 0 ?  rosuvastatin (CRESTOR) 10 MG tablet, Take 1 tablet (10 mg total) by mouth daily., Disp: 90 tablet, Rfl: 0  ? ?Allergies  ?Allergen Reactions  ? Zoloft [Sertraline]   ?  Restless legs could not sleep  ? ? ?Past Medical History:  ?Diagnosis Date  ? Chronic headache   ? Chronic joint pain   ? GERD (gastroesophageal reflux disease)   ? H/O migraine   ? Iron deficiency anemia 2004  ? Secondary menorrhagia  ?  ? ?Past Surgical History:  ?Procedure Laterality Date  ? ABDOMINAL HYSTERECTOMY    ? 2005  ? COLONOSCOPY N/A 01/16/2013  ? Dr. Fields:Normal mucosa in the terminal ileum/Five COLON POLYPS REMOVED/RECTAL BLEEDING DUE TO Moderate sized internal hemorrhoids. hyperplastic polyps  ? ESOPHAGOGASTRODUODENOSCOPY N/A 01/16/2013  ? Dr. Gillie Manners DUE TO MILD GASTRITIS/DUODENITIS AND POSSIBLY GERD/small HH  ? TUBAL LIGATION    ? ? ?Family History  ?Problem Relation Age of Onset  ? COPD Mother   ? Mitral valve prolapse Paternal Aunt   ? Congestive Heart Failure Paternal Grandfather   ? Colon cancer Maternal Grandmother   ? Breast cancer Maternal Aunt   ? ? ?Social History  ? ?Tobacco Use  ? Smoking status: Every Day  ?  Packs/day: 15.00  ?  Years: 0.50  ?  Pack years: 7.50  ?  Types: Cigarettes  ?  Smokeless tobacco: Never  ?Substance Use Topics  ? Alcohol use: No  ?  Alcohol/week: 0.0 standard drinks  ? Drug use: No  ? ? ?ROS ? ? ?Objective:  ? ?Vitals: ?BP (!) 144/92 (BP Location: Right Arm)   Pulse 72   Temp 98.5 ?F (36.9 ?C) (Oral)   Resp 18   SpO2 95%  ? ?Physical Exam ?Constitutional:   ?   General: She is not in acute distress. ?   Appearance: Normal appearance. She is well-developed. She is not ill-appearing, toxic-appearing or diaphoretic.  ?HENT:  ?   Head: Normocephalic and atraumatic.  ?   Nose: Nose normal.  ?   Mouth/Throat:  ?   Mouth: Mucous membranes are moist.  ?Eyes:  ?   General: No scleral icterus.    ?   Right eye: No discharge.     ?   Left eye: No discharge.  ?   Extraocular Movements: Extraocular movements intact.  ?Cardiovascular:  ?   Rate and Rhythm: Normal rate.  ?   Heart sounds: No murmur heard. ?  No friction rub. No gallop.  ?Pulmonary:  ?   Effort: Pulmonary effort is normal. No respiratory distress.  ?   Breath sounds: No stridor. Examination of the right-upper field reveals wheezing. Examination of the left-upper field reveals wheezing. Examination of the right-middle field reveals wheezing and rhonchi. Examination of  the left-middle field reveals wheezing and rhonchi. Examination of the right-lower field reveals rhonchi. Examination of the left-lower field reveals rhonchi. Wheezing and rhonchi present. No rales.  ?Chest:  ?   Chest wall: No tenderness.  ?Skin: ?   General: Skin is warm and dry.  ?Neurological:  ?   General: No focal deficit present.  ?   Mental Status: She is alert and oriented to person, place, and time.  ?Psychiatric:     ?   Mood and Affect: Mood normal.     ?   Behavior: Behavior normal.  ? ? ?Assessment and Plan :  ? ?PDMP not reviewed this encounter. ? ?1. Acute bronchitis, unspecified organism   ?2. Rhonchi   ?3. Wheezing   ?4. Smoker   ? ?Given no symptoms suspicious for pneumonia, deferred imaging.  In the context of her smoking and lung sounds  recommended an oral prednisone course, albuterol use.  Use supportive care otherwise.  Counseled patient on potential for adverse effects with medications prescribed/recommended today, ER and return-to-clinic precautions discussed, patient verbalized understanding. ? ?  ?Jaynee Eagles, PA-C ?05/19/21 1450 ? ?

## 2021-05-19 NOTE — ED Triage Notes (Signed)
Pt reports cough, nasal congestion, chest congestion x 4 days. OTC meds gives some relief.  ?

## 2021-05-24 ENCOUNTER — Ambulatory Visit (INDEPENDENT_AMBULATORY_CARE_PROVIDER_SITE_OTHER): Payer: BC Managed Care – PPO

## 2021-05-24 ENCOUNTER — Other Ambulatory Visit: Payer: Self-pay

## 2021-05-24 ENCOUNTER — Ambulatory Visit
Admission: RE | Admit: 2021-05-24 | Discharge: 2021-05-24 | Disposition: A | Discharge status: Home / Self Care | Payer: BC Managed Care – PPO | Source: Ambulatory Visit | Attending: Urgent Care | Admitting: Urgent Care

## 2021-05-24 VITALS — BP 127/87 | HR 95 | Temp 98.1°F | Resp 18

## 2021-05-24 DIAGNOSIS — J019 Acute sinusitis, unspecified: Secondary | ICD-10-CM | POA: Diagnosis not present

## 2021-05-24 DIAGNOSIS — F17200 Nicotine dependence, unspecified, uncomplicated: Secondary | ICD-10-CM

## 2021-05-24 DIAGNOSIS — R9389 Abnormal findings on diagnostic imaging of other specified body structures: Secondary | ICD-10-CM

## 2021-05-24 DIAGNOSIS — R059 Cough, unspecified: Secondary | ICD-10-CM

## 2021-05-24 MED ORDER — AMOXICILLIN-POT CLAVULANATE 875-125 MG PO TABS
1.0000 | ORAL_TABLET | Freq: Two times a day (BID) | ORAL | 0 refills | Status: DC
Start: 2021-05-24 — End: 2021-05-31

## 2021-05-24 MED ORDER — PSEUDOEPHEDRINE HCL 60 MG PO TABS: 60.0000 mg | ORAL_TABLET | Freq: Three times a day (TID) | ORAL | 0 refills | Status: DC | PRN

## 2021-05-24 MED ORDER — LEVOCETIRIZINE DIHYDROCHLORIDE 5 MG PO TABS
5.0000 mg | ORAL_TABLET | Freq: Every evening | ORAL | 0 refills | Status: DC
Start: 2021-05-24 — End: 2021-08-16

## 2021-05-24 NOTE — ED Triage Notes (Signed)
Pt reports nasal congestion, sinus pressure  and wheezing 1 1/2 week, States she finished the prednisone this morning, breathing improved.  ?

## 2021-05-24 NOTE — ED Provider Notes (Signed)
?[place] ? ? ?MRN: [id number] DOB: Jul 20, 1965 ? ?Subjective:  ? ?Suzanne Hart is a 56 y.o. female presenting for 10-day history of persistent and worsening sinus congestion, sinus pressure.  She was seen last week for a cough, chest tightness and wheezing.  She underwent a course of prednisone and this helped but she wants to make sure she gets a recheck.  She is working on quitting smoking still, is down to quarter pack per day. ? ?No current facility-administered medications for this encounter. ? ?Current Outpatient Medications:  ?  albuterol (VENTOLIN HFA) 108 (90 Base) MCG/ACT inhaler, Inhale 1-2 puffs into the lungs every 6 (six) hours as needed for wheezing or shortness of breath., Disp: 18 g, Rfl: 0 ?  benzonatate (TESSALON) 100 MG capsule, Take 1-2 capsules (100-200 mg total) by mouth 3 (three) times daily as needed for cough., Disp: 60 capsule, Rfl: 0 ?  FLUoxetine (PROZAC) 20 MG capsule, Take 1 capsule (20 mg total) by mouth daily., Disp: 90 capsule, Rfl: 0 ?  predniSONE (DELTASONE) 50 MG tablet, Take 1 tablet (50 mg total) by mouth daily with breakfast., Disp: 5 tablet, Rfl: 0 ?  promethazine-dextromethorphan (PROMETHAZINE-DM) 6.25-15 MG/5ML syrup, Take 5 mLs by mouth at bedtime as needed for cough., Disp: 100 mL, Rfl: 0 ?  rosuvastatin (CRESTOR) 10 MG tablet, Take 1 tablet (10 mg total) by mouth daily., Disp: 90 tablet, Rfl: 0  ? ?Allergies  ?Allergen Reactions  ? Zoloft [Sertraline]   ?  Restless legs could not sleep  ? ? ?Past Medical History:  ?Diagnosis Date  ? Chronic headache   ? Chronic joint pain   ? GERD (gastroesophageal reflux disease)   ? H/O migraine   ? Iron deficiency anemia 2004  ? Secondary menorrhagia  ?  ? ?Past Surgical History:  ?Procedure Laterality Date  ? ABDOMINAL HYSTERECTOMY    ? 2005  ? COLONOSCOPY N/A 01/16/2013  ? Dr. Fields:Normal mucosa in the terminal ileum/Five COLON POLYPS REMOVED/RECTAL BLEEDING DUE TO Moderate sized internal hemorrhoids.  hyperplastic polyps  ? ESOPHAGOGASTRODUODENOSCOPY N/A 01/16/2013  ? Dr. Gillie Manners DUE TO MILD GASTRITIS/DUODENITIS AND POSSIBLY GERD/small HH  ? TUBAL LIGATION    ? ? ?Family History  ?Problem Relation Age of Onset  ? COPD Mother   ? Mitral valve prolapse Paternal Aunt   ? Congestive Heart Failure Paternal Grandfather   ? Colon cancer Maternal Grandmother   ? Breast cancer Maternal Aunt   ? ? ?Social History  ? ?Tobacco Use  ? Smoking status: Every Day  ?  Packs/day: 15.00  ?  Years: 0.50  ?  Pack years: 7.50  ?  Types: Cigarettes  ? Smokeless tobacco: Never  ?Substance Use Topics  ? Alcohol use: No  ?  Alcohol/week: 0.0 standard drinks  ? Drug use: No  ? ? ?ROS ? ? ?Objective:  ? ?Vitals: ?BP 127/87 (BP Location: Right Arm)   Pulse 95   Temp 98.1 ?F (36.7 ?C) (Oral)   Resp 18   SpO2 95%  ? ?Physical Exam ?Constitutional:   ?   General: She is not in acute distress. ?   Appearance: Normal appearance. She is well-developed. She is not ill-appearing, toxic-appearing or diaphoretic.  ?HENT:  ?   Head: Normocephalic and atraumatic.  ?   Nose: Nose normal.  ?   Mouth/Throat:  ?   Mouth: Mucous membranes are moist.  ?Eyes:  ?   General: No scleral icterus.    ?   Right eye:  No discharge.     ?   Left eye: No discharge.  ?   Extraocular Movements: Extraocular movements intact.  ?Cardiovascular:  ?   Rate and Rhythm: Normal rate.  ?   Heart sounds: No murmur heard. ?  No friction rub. No gallop.  ?Pulmonary:  ?   Effort: Pulmonary effort is normal. No respiratory distress.  ?   Breath sounds: No stridor. Examination of the right-lower field reveals wheezing. Examination of the left-lower field reveals wheezing. Wheezing present. No rhonchi or rales.  ?Chest:  ?   Chest wall: No tenderness.  ?Skin: ?   General: Skin is warm and dry.  ?Neurological:  ?   General: No focal deficit present.  ?   Mental Status: She is alert and oriented to person, place, and time.  ?Psychiatric:     ?   Mood and Affect: Mood normal.      ?   Behavior: Behavior normal.  ? ? ?DG Chest 2 View ? ?Result Date: 05/24/2021 ?CLINICAL DATA:  Cough for 1 week, smoker. EXAM: CHEST - 2 VIEW COMPARISON:  None. FINDINGS: Trachea is midline. Heart size normal. Small area of added density along the ventral margin the major fissures on the lateral view, poorly correlated on the frontal view. Otherwise, lungs are clear. No pleural fluid. IMPRESSION: 1. No acute findings. 2. Small area of added density along the overlapping fissures on the lateral view. Non emergent CT chest without contrast or lung cancer screening CT recommended, as a pulmonary nodule cannot be excluded. Low-dose CT lung cancer screening is recommended for patients who are 27-36 years of age with a 20+ pack-year history of smoking, and who are currently smoking or quit <=15 years ago. Electronically Signed   By: Lorin Picket M.D.   On: 05/24/2021 11:27   ? ? ?Assessment and Plan :  ? ?PDMP not reviewed this encounter. ? ?1. Acute non-recurrent sinusitis, unspecified location   ?2. Smoker   ?3. Abnormal chest x-ray   ? ?Patient has not yet picked up the albuterol inhaler and therefore I recommend that she do so.  I reviewed her chest x-ray results with.  Recommended follow-up with her PCP for nonemergent chest CT scan to further evaluate the abnormal area suspicious for a pulmonary nodule.  Recommended she continue efforts at quitting smoking. Will start empiric treatment for sinusitis with Augmentin.  Recommended supportive care otherwise including the use of oral antihistamine, decongestant. Counseled patient on potential for adverse effects with medications prescribed/recommended today, ER and return-to-clinic precautions discussed, patient verbalized understanding. ? ?  ?Jaynee Eagles, PA-C ?05/24/21 1209 ? ?

## 2021-05-25 ENCOUNTER — Encounter: Payer: Self-pay | Admitting: Family Medicine

## 2021-05-26 NOTE — Telephone Encounter (Signed)
I reviewed over the note, I also reviewed over the x-ray and looked at the images ?There is an area that it is hard to know for certain so therefore follow-up imaging would be recommended.  It would be wise to do the follow-up. ? ?Nurses-please set up a an appointment in one of my open slots for this coming week to see Suzanne Hart, we can show her the images and discuss setting her up for a scan. ?

## 2021-05-26 NOTE — Telephone Encounter (Signed)
Patient notified and scheduled follow up office visit 05/30/21 with Dr Nicki Reaper ?

## 2021-05-30 ENCOUNTER — Other Ambulatory Visit: Payer: Self-pay

## 2021-05-30 ENCOUNTER — Ambulatory Visit: Payer: BC Managed Care – PPO | Admitting: Family Medicine

## 2021-05-30 VITALS — BP 125/83 | Ht 65.0 in | Wt 204.6 lb

## 2021-05-30 DIAGNOSIS — E785 Hyperlipidemia, unspecified: Secondary | ICD-10-CM

## 2021-05-30 DIAGNOSIS — L739 Follicular disorder, unspecified: Secondary | ICD-10-CM | POA: Diagnosis not present

## 2021-05-30 DIAGNOSIS — F17200 Nicotine dependence, unspecified, uncomplicated: Secondary | ICD-10-CM

## 2021-05-30 DIAGNOSIS — R911 Solitary pulmonary nodule: Secondary | ICD-10-CM | POA: Diagnosis not present

## 2021-05-30 NOTE — Patient Instructions (Signed)
Plain Allegra or Claritin ?Flonase ?Astelin ?

## 2021-05-30 NOTE — Progress Notes (Signed)
? ?  Subjective:  ? ? Patient ID: Suzanne Hart, female    DOB: 1966-02-22, 55 y.o.   MRN: [id number] ? ?HPI ?Patient arrives for a follow up on an abnormal Chest X Ray done recently at urgent care. ?Insect bite of left thigh, initial encounter ? ?Pulmonary nodule - Plan: CT Chest Wo Contrast ? ?Smoker ? ?Hyperlipidemia, unspecified hyperlipidemia type - Plan: Lipid panel ?Patient had recent insect bite ?She states it is tender small redness to it.  Never had this before.  Inner thigh ? ?Patient also had abnormal x-ray which showed area of concern that they recommend a CT scan patient is a smoker she has been counseled to quit smoking ? ?Significant hyperlipidemia with LDL very elevated puts her at increased risk of heart disease and strokes she is not wanting to be on medication we will repeat labs ?Review of Systems ? ?   ?Objective:  ? Physical Exam ?Lungs clear heart regular pulse normal BP good ?Nurse present-erythematous area consistent with folliculitis ?   ?Assessment & Plan:  ? ?1.  Folliculitis ?Patient needs to do warm compresses frequently. ? ?2. Pulmonary nodule ?There is an area on the chest x-ray concerning for possibility of pulmonary nodule recommend CT scan by radiology.  Scan ordered.  Patient strongly encouraged to quit smoking ?We did discuss low-dose CT scanning on a yearly basis but we also discussed how this has its potential benefits and risk ?- CT Chest Wo Contrast ? ?3. Smoker ?Patient is a smoker highly encouraged to quit smoking at 100% ? ?4. Hyperlipidemia, unspecified hyperlipidemia type ?LDL very elevated puts her at increased risk patient was encouraged healthy diet encouraged to repeat labs if LDL still that high I recommend statin ?- Lipid panel ?We will let the patient know the results of her CT scan ?

## 2021-05-31 ENCOUNTER — Encounter: Payer: Self-pay | Admitting: Family Medicine

## 2021-05-31 ENCOUNTER — Ambulatory Visit (INDEPENDENT_AMBULATORY_CARE_PROVIDER_SITE_OTHER): Payer: BC Managed Care – PPO | Admitting: Family Medicine

## 2021-05-31 ENCOUNTER — Telehealth: Payer: Self-pay | Admitting: Family Medicine

## 2021-05-31 DIAGNOSIS — U071 COVID-19: Secondary | ICD-10-CM

## 2021-05-31 MED ORDER — NIRMATRELVIR/RITONAVIR (PAXLOVID)TABLET: 3.0000 | ORAL_TABLET | Freq: Two times a day (BID) | ORAL | 0 refills | Status: AC

## 2021-05-31 NOTE — Progress Notes (Signed)
? ?  Subjective:  ? ? Patient ID: BRIANA FARNER, female    DOB: 05-Jul-1965, 56 y.o.   MRN: [id number] ? ?HPI ?Pt having headache, chills, fever, cough. Tested positive this morning.   ?Body aches fever chills does not feel good some cough some runny nose some sore throat no wheezing or difficulty breathing PMH benign started up last night got worse during the night feels bad today but no breathing difficulty shortness of breath or passing out ?Virtual Visit via Telephone Note ? ?I connected with Phuong Hillary Wimberley on 05/31/21 at  3:50 PM EDT by telephone and verified that I am speaking with the correct person using two identifiers. ? ?Location: ?Patient: home ?Provider: office ?  ?I discussed the limitations, risks, security and privacy concerns of performing an evaluation and management service by telephone and the availability of in person appointments. I also discussed with the patient that there may be a patient responsible charge related to this service. The patient expressed understanding and agreed to proceed. ? ? ?History of Present Illness: ? ?  ?Observations/Objective: ? ? ?Assessment and Plan: ? ? ?Follow Up Instructions: ? ?  ?I discussed the assessment and treatment plan with the patient. The patient was provided an opportunity to ask questions and all were answered. The patient agreed with the plan and demonstrated an understanding of the instructions. ?  ?The patient was advised to call back or seek an in-person evaluation if the symptoms worsen or if the condition fails to improve as anticipated. ? ?I provided  15 bminutes of non-face-to-face time during this encounter. ? ? ?  ? ? ?Review of Systems ? ?   ?Objective:  ? Physical Exam ?Today's visit was via telephone ?Physical exam was not possible for this visit ? ? ? ? ?   ?Assessment & Plan:  ?We did discuss medication ?Discussed COVID ?Rest up over the next several days ?Self-isolation ?Wear a mask when around others ?Avoid being around others ?After 5 days  if feeling well may be around others with a mask for 5 additional days ?Work note given through Sunday ?May give longer work note if she is not doing better by Monday ?Warning signs discussed ?Patient with shared discussion decided to start medicine ?Paxlovid ?Previous GFR looks good ?Stop statin while on this medicine ?Twice a day treatment for 5 days ?Follow-up if ongoing troubles ?Warnings discussed ? ?

## 2021-05-31 NOTE — Telephone Encounter (Signed)
Ms. Branden,you are scheduled for a virtual visit with your provider today.   ? ?Just as we do with appointments in the office, we must obtain your consent to participate.  Your consent will be active for this visit and any virtual visit you may have with one of our providers in the next 365 days.   ? ?If you have a MyChart account, I can also send a copy of this consent to you electronically.  All virtual visits are billed to your insurance company just like a traditional visit in the office.  As this is a virtual visit, video technology does not allow for your provider to perform a traditional examination.  This may limit your provider's ability to fully assess your condition.  If your provider identifies any concerns that need to be evaluated in person or the need to arrange testing such as labs, EKG, etc, we will make arrangements to do so.   ? ?Although advances in technology are sophisticated, we cannot ensure that it will always work on either your end or our end.  If the connection with a video visit is poor, we may have to switch to a telephone visit.  With either a video or telephone visit, we are not always able to ensure that we have a secure connection.   I need to obtain your verbal consent now.   Are you willing to proceed with your visit today?  ? ?Brandie Lopes Elmes has provided verbal consent on 05/31/2021 for a virtual visit (video or telephone). ? ? ?Vicente Males, LPN ?0/10/6759  95:09 AM ?  ?

## 2021-05-31 NOTE — Telephone Encounter (Signed)
Nurses ?By protocol she will have to stay self isolated minimum of 5 days ?Tylenol for body aches ?There are medications that can help shorten the course of COVID in lessen the risk of significant complications ?These medications include Paxlovid and molnupiravir ? ?If she is interested in these medications I would need to do a clinical evaluation this could be done via phone/video if patient is interested in doing so put her on my schedule-currently that would be toward the end of the afternoon but if she is available during lunch hour I may well be able to connect with her at that time ?

## 2021-07-11 ENCOUNTER — Ambulatory Visit (HOSPITAL_COMMUNITY)
Admission: RE | Admit: 2021-07-11 | Discharge: 2021-07-11 | Disposition: A | Discharge status: Home / Self Care | Payer: BC Managed Care – PPO | Source: Ambulatory Visit | Attending: Family Medicine | Admitting: Family Medicine

## 2021-07-11 DIAGNOSIS — R911 Solitary pulmonary nodule: Secondary | ICD-10-CM | POA: Insufficient documentation

## 2021-07-12 ENCOUNTER — Encounter: Payer: Self-pay | Admitting: Family Medicine

## 2021-07-19 ENCOUNTER — Ambulatory Visit (INDEPENDENT_AMBULATORY_CARE_PROVIDER_SITE_OTHER): Payer: BC Managed Care – PPO | Admitting: Family Medicine

## 2021-07-19 VITALS — BP 121/80 | HR 71 | Temp 97.7°F | Ht 65.0 in | Wt 204.0 lb

## 2021-07-19 DIAGNOSIS — R5383 Other fatigue: Secondary | ICD-10-CM | POA: Diagnosis not present

## 2021-07-19 DIAGNOSIS — E278 Other specified disorders of adrenal gland: Secondary | ICD-10-CM | POA: Diagnosis not present

## 2021-07-19 DIAGNOSIS — R911 Solitary pulmonary nodule: Secondary | ICD-10-CM

## 2021-07-19 MED ORDER — FLUOXETINE HCL 10 MG PO CAPS: 10.0000 mg | ORAL_CAPSULE | Freq: Every day | ORAL | 1 refills | Status: DC

## 2021-07-19 NOTE — Progress Notes (Signed)
? ?  Subjective:  ? ? Patient ID: Suzanne Hart, female    DOB: Aug 17, 1965, 56 y.o.   MRN: [id number] ? ?HPI ?Discuss CT Chest results  ?We did discuss CT scan in detail including subpleural nodule as well as adrenal nodule ?Patient does relate a lot of fatigue tiredness feeling rundown. ?Patient is a smoker she knows she needs to quit we did discuss this in detail ? ?Review of Systems ? ?   ?Objective:  ? Physical Exam ? ?Full visit was discussion today ? ? ?   ?Assessment & Plan:  ?1. Adrenal nodule ([name]) ?Referral to endocrinology for further work-up ?- Ambulatory referral to Endocrinology ? ?2. Pulmonary nodule ?Recommend follow-up CT scan in approximately 5 months making sure that there is no appreciable change within the nodule ?This was put into the reminder file for October ?- Ambulatory referral to Endocrinology ? ?3. Other fatigue ?Patient denies sleep apnea symptoms ?If after getting evaluated by endocrinology problem is not found consider additional labs ?- Ambulatory referral to Endocrinology ? ?Patient strongly encouraged to quit smoking ?

## 2021-08-16 ENCOUNTER — Encounter: Payer: Self-pay | Admitting: "Endocrinology

## 2021-08-16 ENCOUNTER — Ambulatory Visit: Payer: BC Managed Care – PPO | Admitting: "Endocrinology

## 2021-08-16 VITALS — BP 134/96 | HR 80 | Ht 65.0 in | Wt 205.0 lb

## 2021-08-16 DIAGNOSIS — Z6834 Body mass index (BMI) 34.0-34.9, adult: Secondary | ICD-10-CM

## 2021-08-16 DIAGNOSIS — E782 Mixed hyperlipidemia: Secondary | ICD-10-CM

## 2021-08-16 DIAGNOSIS — D3502 Benign neoplasm of left adrenal gland: Secondary | ICD-10-CM | POA: Diagnosis not present

## 2021-08-16 DIAGNOSIS — E6609 Other obesity due to excess calories: Secondary | ICD-10-CM | POA: Insufficient documentation

## 2021-08-16 DIAGNOSIS — R030 Elevated blood-pressure reading, without diagnosis of hypertension: Secondary | ICD-10-CM

## 2021-08-16 DIAGNOSIS — F17200 Nicotine dependence, unspecified, uncomplicated: Secondary | ICD-10-CM

## 2021-08-16 NOTE — Progress Notes (Signed)
Endocrinology Consult Note                                            08/16/2021, 11:47 AM   Subjective:    Patient ID: Suzanne Hart, female    DOB: 1965-11-27, PCP Suzanne Drown, MD   Past Medical History:  Diagnosis Date   Chronic headache    Chronic joint pain    GERD (gastroesophageal reflux disease)    H/O migraine    Iron deficiency anemia 2004   Secondary menorrhagia   Past Surgical History:  Procedure Laterality Date   ABDOMINAL HYSTERECTOMY     2005   COLONOSCOPY N/A 01/16/2013   Dr. Fields:Normal mucosa in the terminal ileum/Five COLON POLYPS REMOVED/RECTAL BLEEDING DUE TO Moderate sized internal hemorrhoids. hyperplastic polyps   ESOPHAGOGASTRODUODENOSCOPY N/A 01/16/2013   Dr. Gillie Manners DUE TO MILD GASTRITIS/DUODENITIS AND POSSIBLY GERD/small [place]   TUBAL LIGATION     Social History   Socioeconomic History   Marital status: Married    Spouse name: Not on file   Number of children: Not on file   Years of education: Not on file   Highest education level: Not on file  Occupational History   Occupation: [employer]    Comment: Transportation  Tobacco Use   Smoking status: Every Day    Packs/day: 15.00    Years: 0.50    Pack years: 7.50    Types: Cigarettes   Smokeless tobacco: Never  Vaping Use   Vaping Use: Never used  Substance and Sexual Activity   Alcohol use: No    Alcohol/week: 0.0 standard drinks   Drug use: No   Sexual activity: Yes  Other Topics Concern   Not on file  Social History Narrative   Not on file   Social Determinants of Health   Financial Resource Strain: Not on file  Food Insecurity: Not on file  Transportation Needs: Not on file  Physical Activity: Not on file  Stress: Not on file  Social Connections: Not on file   Family History  Problem Relation Age of Onset   COPD Mother    Mitral valve prolapse Paternal Aunt    Congestive Heart Failure Paternal Grandfather    Colon cancer  Maternal Grandmother    Breast cancer Maternal Aunt    Outpatient Encounter Medications as of 08/16/2021  Medication Sig   Multiple Vitamin (MULTIVITAMIN ADULT PO) Take 1 tablet by mouth daily.   FLUoxetine (PROZAC) 10 MG capsule Take 1 capsule (10 mg total) by mouth daily.   [DISCONTINUED] levocetirizine (XYZAL) 5 MG tablet Take 1 tablet (5 mg total) by mouth every evening. (Patient not taking: Reported on 07/19/2021)   [DISCONTINUED] pseudoephedrine (SUDAFED) 60 MG tablet Take 1 tablet (60 mg total) by mouth every 8 (eight) hours as needed for congestion. (Patient not taking: Reported on 05/31/2021)   [DISCONTINUED] rosuvastatin (CRESTOR) 10 MG tablet Take 1 tablet (10 mg total) by mouth daily. (Patient not taking: Reported on 07/19/2021)   No facility-administered encounter medications on file as of 08/16/2021.   ALLERGIES: Allergies  Allergen Reactions   Zoloft [Sertraline]     Restless legs could not sleep    VACCINATION STATUS: Immunization History  Administered Date(s) Administered   Influenza,inj,Quad PF,6+ Mos 01/15/2018, 03/24/2019   Td 11/14/2016    HPI Suzanne Hart is  56 y.o. female who presents today with a medical history as above. she is being seen in consultation for adrenal incidentaloma requested by Suzanne Drown, MD.  History is obtained directly from the patient as well as chart review.  While she was undergoing work-up for cough on the background of chronic heavy smoking, she underwent CT scan of the chest and abdomen which incidentally showed 10 mm left adrenal nodule described as an adenoma.   She denies any prior history of adrenal dysfunction.  She does not have hormonal work-up of this nodule. She denies any history of chronic hypertension, however was found to have elevated blood pressure this morning.  She denies palpitations, headaches nor syncopal spells.  She reports mildly fluctuating body weight with no major recent loss or gain.    She denies any family  history of adrenal, pituitary, nor thyroid dysfunction.  Review of her medical history shows significant dyslipidemia with LDL of 205, and blood pressure of 134/96 this morning.  She is also obese with a BMI of 34.1.  She is not on statins nor antihypertensive medications.  Review of Systems  Constitutional: + Minimally fluctuating body weight, no fatigue, no subjective hyperthermia, no subjective hypothermia Eyes: no blurry vision, no xerophthalmia ENT: no sore throat, no nodules palpated in throat, no dysphagia/odynophagia, no hoarseness Cardiovascular: no Chest Pain, no Shortness of Breath, no palpitations, no leg swelling Respiratory: no cough, no shortness of breath Gastrointestinal: no Nausea/Vomiting/Diarhhea Musculoskeletal: no muscle/joint aches Skin: no rashes Neurological: no tremors, no numbness, no tingling, no dizziness Psychiatric: no depression, no anxiety  Objective:       08/16/2021    8:15 AM 07/19/2021   11:13 AM 05/30/2021    1:44 PM  Vitals with BMI  Height '5\' 5"'$  '5\' 5"'$  '5\' 5"'$   Weight 205 lbs 204 lbs 204 lbs 10 oz  BMI 34.11 41.66 06.30  Systolic 160 109 323  Diastolic 96 80 83  Pulse 80 71     BP (!) 134/96   Pulse 80   Ht '5\' 5"'$  (1.651 m)   Wt 205 lb (93 kg)   BMI 34.11 kg/m   Wt Readings from Last 3 Encounters:  08/16/21 205 lb (93 kg)  07/19/21 204 lb (92.5 kg)  05/30/21 204 lb 9.6 oz (92.8 kg)    Physical Exam  Constitutional:  Body mass index is 34.11 kg/m.,  not in acute distress, normal state of mind Eyes: PERRLA, EOMI, no exophthalmos ENT: moist mucous membranes, no gross thyromegaly, no gross cervical lymphadenopathy Cardiovascular: normal precordial activity, Regular Rate and Rhythm, no Murmur/Rubs/Gallops Respiratory:  adequate breathing efforts, no gross chest deformity, Clear to auscultation bilaterally Gastrointestinal: abdomen soft, Non -tender, No distension, Bowel Sounds present, no gross organomegaly Musculoskeletal: no gross  deformities, strength intact in all four extremities Skin: moist, warm, no rashes Neurological: no tremor with outstretched hands, Deep tendon reflexes normal in bilateral lower extremities.  CMP ( most recent) CMP     Component Value Date/Time   NA 139 10/26/2020 0826   K 5.1 10/26/2020 0826   CL 102 10/26/2020 0826   CO2 25 10/26/2020 0826   GLUCOSE 109 (H) 10/26/2020 0826   BUN 12 10/26/2020 0826   CREATININE 0.71 10/26/2020 0826   CALCIUM 9.4 10/26/2020 0826   PROT 7.1 10/26/2020 0826   ALBUMIN 4.4 10/26/2020 0826   AST 17 10/26/2020 0826   ALT 27 10/26/2020 0826   ALKPHOS 114 10/26/2020 0826   BILITOT 0.3 10/26/2020 5573  GFRNONAA 100 01/06/2018 0937   GFRAA 115 01/06/2018 0937      Lipid Panel ( most recent) Lipid Panel     Component Value Date/Time   CHOL 272 (H) 10/26/2020 0826   TRIG 115 10/26/2020 0826   HDL 46 10/26/2020 0826   CHOLHDL 5.9 (H) 10/26/2020 0826   LDLCALC 205 (H) 10/26/2020 0826   LABVLDL 21 10/26/2020 0826      Lab Results  Component Value Date   TSH 2.430 10/26/2020   TSH 1.880 01/06/2018   FREET4 1.04 01/06/2018           Assessment & Plan:   1. Adrenal adenoma, left   - Suzanne Hart  is being seen at a kind request of Luking, Elayne Snare, MD. - I have reviewed her available endocrine records and clinically evaluated the patient. - Based on these reviews, she has left adrenal adenoma.  The radiologist description of this nodule favors adenoma which will not need surgical intervention at this time. However, she will need functional assessment with 24-hour urine collection for cortisol, metanephrines, and catecholamines.  She will also need CMP, and thyroid function test.  In light of her presentation consistent with metabolic syndrome including elevated blood pressure, and severe dyslipidemia, obesity, this patient would benefit from lifestyle medicine.  She is not on statins, evidently hesitant.  She is briefly counseled on whole  food plant-based diet to address dyslipidemia.  She will be approached for details of lifestyle medicine next visit., She is also counseled on smoking cessation. -She will have point-of-care A1c next visit.  - I did not initiate any new prescriptions today. - she is advised to maintain close follow up with Suzanne Drown, MD for primary care needs.   - Time spent with the patient: 50 minutes, of which >50% was spent in  counseling her about her left adrenal adenoma, dyslipidemia, elevated blood pressure and the rest in obtaining information about her symptoms, reviewing her previous labs/studies ( including abstractions from other facilities),  evaluations, and treatments,  and developing a plan to confirm diagnosis and long term treatment based on the latest standards of care/guidelines; and documenting her care.  Gardiner Rhyme Keaney participated in the discussions, expressed understanding, and voiced agreement with the above plans.  All questions were answered to her satisfaction. she is encouraged to contact clinic should she have any questions or concerns prior to her return visit.  Follow up plan: Return in about 10 days (around 08/26/2021) for F/U with Pre-visit Labs, A1c -[place].   Glade Lloyd, MD [facility] [address] [place], [place] [address] Phone: [phone]  Fax: [phone]     08/16/2021, 11:47 AM  This note was partially dictated with voice recognition software. Similar sounding words can be transcribed inadequately or may not  be corrected upon review.

## 2021-08-17 LAB — COMPREHENSIVE METABOLIC PANEL
ALT: 19 IU/L (ref 0–32)
AST: 17 IU/L (ref 0–40)
Albumin/Globulin Ratio: 1.7 (ref 1.2–2.2)
Albumin: 4.5 g/dL (ref 3.8–4.9)
Alkaline Phosphatase: 112 IU/L (ref 44–121)
BUN/Creatinine Ratio: 21 (ref 9–23)
BUN: 15 mg/dL (ref 6–24)
Bilirubin Total: 0.3 mg/dL (ref 0.0–1.2)
CO2: 24 mmol/L (ref 20–29)
Calcium: 9.1 mg/dL (ref 8.7–10.2)
Chloride: 101 mmol/L (ref 96–106)
Creatinine, Ser: 0.71 mg/dL (ref 0.57–1.00)
Globulin, Total: 2.6 g/dL (ref 1.5–4.5)
Glucose: 99 mg/dL (ref 70–99)
Potassium: 4.3 mmol/L (ref 3.5–5.2)
Sodium: 140 mmol/L (ref 134–144)
Total Protein: 7.1 g/dL (ref 6.0–8.5)
eGFR: 100 mL/min/{1.73_m2} (ref >59)

## 2021-08-17 LAB — TSH: TSH: 2.38 u[IU]/mL (ref 0.450–4.500)

## 2021-08-17 LAB — T4, FREE: Free T4: 0.95 ng/dL (ref 0.82–1.77)

## 2021-08-27 LAB — CREATININE, URINE, 24 HOUR
Creatinine, 24H Ur: 1200 mg/24 hr (ref 800–1800)
Creatinine, Urine: 85.7 mg/dL

## 2021-08-29 ENCOUNTER — Ambulatory Visit: Payer: BC Managed Care – PPO | Admitting: "Endocrinology

## 2021-08-29 LAB — CORTISOL, URINE, FREE
Cortisol (Ur), Free: 34 ug/24 hr (ref 6–42)
Cortisol,F,ug/L,U: 24 ug/L

## 2021-08-30 ENCOUNTER — Other Ambulatory Visit: Payer: Self-pay | Admitting: *Deleted

## 2021-08-30 DIAGNOSIS — Z1231 Encounter for screening mammogram for malignant neoplasm of breast: Secondary | ICD-10-CM

## 2021-08-30 LAB — METANEPHRINES, URINE, 24 HOUR
Metaneph Total, Ur: 73 ug/L
Metanephrines, 24H Ur: 102 ug/24 hr (ref 36–209)
Normetanephrine, 24H Ur: 413 ug/24 hr (ref 131–612)
Normetanephrine, Ur: 295 ug/L

## 2021-08-31 LAB — CATECHOLAMINES, FRACTIONATED, URINE, 24 HOUR
Dopamine , 24H Ur: 318 ug/24 hr (ref 0–510)
Dopamine, Rand Ur: 227 ug/L
Epinephrine, 24H Ur: 6 ug/24 hr (ref 0–20)
Epinephrine, Rand Ur: 4 ug/L
Norepinephrine, 24H Ur: 88 ug/24 hr (ref 0–135)
Norepinephrine, Rand Ur: 63 ug/L

## 2021-09-04 ENCOUNTER — Ambulatory Visit (HOSPITAL_COMMUNITY): Payer: BC Managed Care – PPO

## 2021-09-11 ENCOUNTER — Ambulatory Visit (HOSPITAL_COMMUNITY)
Admission: RE | Admit: 2021-09-11 | Discharge: 2021-09-11 | Disposition: A | Discharge status: Home / Self Care | Payer: BC Managed Care – PPO | Source: Ambulatory Visit | Attending: Family Medicine | Admitting: Family Medicine

## 2021-09-11 DIAGNOSIS — Z1231 Encounter for screening mammogram for malignant neoplasm of breast: Secondary | ICD-10-CM | POA: Insufficient documentation

## 2021-09-13 ENCOUNTER — Encounter: Payer: Self-pay | Admitting: "Endocrinology

## 2021-09-13 ENCOUNTER — Ambulatory Visit: Payer: BC Managed Care – PPO | Admitting: "Endocrinology

## 2021-09-13 VITALS — BP 104/76 | HR 68 | Ht 65.0 in | Wt 203.6 lb

## 2021-09-13 DIAGNOSIS — E782 Mixed hyperlipidemia: Secondary | ICD-10-CM

## 2021-09-13 DIAGNOSIS — R7303 Prediabetes: Secondary | ICD-10-CM | POA: Insufficient documentation

## 2021-09-13 DIAGNOSIS — E6609 Other obesity due to excess calories: Secondary | ICD-10-CM

## 2021-09-13 DIAGNOSIS — D3502 Benign neoplasm of left adrenal gland: Secondary | ICD-10-CM

## 2021-09-13 DIAGNOSIS — R030 Elevated blood-pressure reading, without diagnosis of hypertension: Secondary | ICD-10-CM

## 2021-09-13 DIAGNOSIS — Z6833 Body mass index (BMI) 33.0-33.9, adult: Secondary | ICD-10-CM

## 2021-09-13 LAB — POCT GLYCOSYLATED HEMOGLOBIN (HGB A1C): HbA1c, POC (controlled diabetic range): 5.8 % (ref 0.0–7.0)

## 2021-09-13 NOTE — Progress Notes (Signed)
09/13/2021, 4:42 PM  Endocrinology follow-up note   Subjective:    Patient ID: Suzanne Hart, female    DOB: 1965/07/16, PCP Kathyrn Drown, MD   Past Medical History:  Diagnosis Date   Chronic headache    Chronic joint pain    GERD (gastroesophageal reflux disease)    H/O migraine    Iron deficiency anemia 2004   Secondary menorrhagia   Past Surgical History:  Procedure Laterality Date   ABDOMINAL HYSTERECTOMY     2005   COLONOSCOPY N/A 01/16/2013   Dr. Fields:Normal mucosa in the terminal ileum/Five COLON POLYPS REMOVED/RECTAL BLEEDING DUE TO Moderate sized internal hemorrhoids. hyperplastic polyps   ESOPHAGOGASTRODUODENOSCOPY N/A 01/16/2013   Dr. Gillie Manners DUE TO MILD GASTRITIS/DUODENITIS AND POSSIBLY GERD/small [place]   TUBAL LIGATION     Social History   Socioeconomic History   Marital status: Married    Spouse name: Not on file   Number of children: Not on file   Years of education: Not on file   Highest education level: Not on file  Occupational History   Occupation: [employer]    Comment: Transportation  Tobacco Use   Smoking status: Every Day    Packs/day: 15.00    Years: 0.50    Total pack years: 7.50    Types: Cigarettes   Smokeless tobacco: Never  Vaping Use   Vaping Use: Never used  Substance and Sexual Activity   Alcohol use: No    Alcohol/week: 0.0 standard drinks of alcohol   Drug use: No   Sexual activity: Yes  Other Topics Concern   Not on file  Social History Narrative   Not on file   Social Determinants of Health   Financial Resource Strain: Not on file  Food Insecurity: Not on file  Transportation Needs: Not on file  Physical Activity: Not on file  Stress: Not on file  Social Connections: Not on file   Family History  Problem Relation Age of Onset   COPD Mother    Mitral valve prolapse Paternal Aunt    Congestive Heart Failure Paternal Grandfather     Colon cancer Maternal Grandmother    Breast cancer Maternal Aunt    Outpatient Encounter Medications as of 09/13/2021  Medication Sig   FLUoxetine (PROZAC) 10 MG capsule Take 1 capsule (10 mg total) by mouth daily.   Multiple Vitamin (MULTIVITAMIN ADULT PO) Take 1 tablet by mouth daily.   No facility-administered encounter medications on file as of 09/13/2021.   ALLERGIES: Allergies  Allergen Reactions   Zoloft [Sertraline]     Restless legs could not sleep    VACCINATION STATUS: Immunization History  Administered Date(s) Administered   Influenza,inj,Quad PF,6+ Mos 01/15/2018, 03/24/2019   Td 11/14/2016    HPI Suzanne Hart is 56 y.o. female who presents today with a medical history as above. she is being seen in follow-up after she was seen in consultation for adrenal incidentaloma requested by Kathyrn Drown, MD.  See notes from her previous visit.  While she was undergoing work-up for cough on the background of chronic heavy smoking, she underwent CT scan of the chest and abdomen which incidentally showed 10 mm left adrenal nodule  described as an adenoma.   Her subsequent function analysis shows a nonfunctioning adrenal adenoma. She denies any prior history of adrenal dysfunction.  She has no new complaints today. She denies any history of chronic hypertension, her blood pressure reading is better today than last visit.   However, she has severe dyslipidemia, prediabetes, class I obesity.   She denies palpitations, headaches nor syncopal spells.  She reports mildly fluctuating body weight with no major recent loss or gain.    She denies any family history of adrenal, pituitary, nor thyroid dysfunction.    She is not on statins nor antihypertensive medications.  Review of Systems  Constitutional: + Minimally fluctuating body weight, no fatigue, no subjective hyperthermia, no subjective hypothermia Eyes: no blurry vision, no xerophthalmia ENT: no sore throat, no nodules  palpated in throat, no dysphagia/odynophagia, no hoarseness Cardiovascular: no Chest Pain, no Shortness of Breath, no palpitations, no leg swelling Respiratory: no cough, no shortness of breath Gastrointestinal: no Nausea/Vomiting/Diarhhea Musculoskeletal: no muscle/joint aches Skin: no rashes Neurological: no tremors, no numbness, no tingling, no dizziness Psychiatric: no depression, no anxiety  Objective:       09/13/2021   11:41 AM 08/16/2021    8:15 AM 07/19/2021   11:13 AM  Vitals with BMI  Height _0  _1  _2   Weight 203 lbs 10 oz 205 lbs 204 lbs  BMI 33.88 16.10 96.04  Systolic 540 981 191  Diastolic 76 96 80  Pulse 68 80 71    BP 104/76   Pulse 68   Ht _3  (1.651 m)   Wt 203 lb 9.6 oz (92.4 kg)   BMI 33.88 kg/m   Wt Readings from Last 3 Encounters:  09/13/21 203 lb 9.6 oz (92.4 kg)  08/16/21 205 lb (93 kg)  07/19/21 204 lb (92.5 kg)    Physical Exam  Constitutional:  Body mass index is 33.88 kg/m.,  not in acute distress, normal state of mind Eyes: PERRLA, EOMI, no exophthalmos ENT: moist mucous membranes, no gross thyromegaly, no gross cervical lymphadenopathy Cardiovascular: normal precordial activity, Regular Rate and Rhythm, no Murmur/Rubs/Gallops Respiratory:  adequate breathing efforts, no gross chest deformity, Clear to auscultation bilaterally Gastrointestinal: abdomen soft, Non -tender, No distension, Bowel Sounds present, no gross organomegaly Musculoskeletal: no gross deformities, strength intact in all four extremities Skin: moist, warm, no rashes Neurological: no tremor with outstretched hands, Deep tendon reflexes normal in bilateral lower extremities.  CMP ( most recent) CMP     Component Value Date/Time   NA 140 08/16/2021 0855   K 4.3 08/16/2021 0855   CL 101 08/16/2021 0855   CO2 24 08/16/2021 0855   GLUCOSE 99 08/16/2021 0855   BUN 15 08/16/2021 0855   CREATININE 0.71 08/16/2021 0855   CALCIUM 9.1 08/16/2021 0855   PROT 7.1  08/16/2021 0855   ALBUMIN 4.5 08/16/2021 0855   AST 17 08/16/2021 0855   ALT 19 08/16/2021 0855   ALKPHOS 112 08/16/2021 0855   BILITOT 0.3 08/16/2021 0855   GFRNONAA 100 01/06/2018 0937   GFRAA 115 01/06/2018 0937      Lipid Panel ( most recent) Lipid Panel     Component Value Date/Time   CHOL 272 (H) 10/26/2020 0826   TRIG 115 10/26/2020 0826   HDL 46 10/26/2020 0826   CHOLHDL 5.9 (H) 10/26/2020 0826   LDLCALC 205 (H) 10/26/2020 0826   LABVLDL 21 10/26/2020 0826      Lab Results  Component Value Date   TSH 2.380  08/16/2021   TSH 2.430 10/26/2020   TSH 1.880 01/06/2018   FREET4 0.95 08/16/2021   FREET4 1.04 01/06/2018         Recent Results (from the past 2160 hour(s))  TSH     Status: None   Collection Time: 08/16/21  8:55 AM  Result Value Ref Range   TSH 2.380 0.450 - 4.500 uIU/mL  T4, free     Status: None   Collection Time: 08/16/21  8:55 AM  Result Value Ref Range   Free T4 0.95 0.82 - 1.77 ng/dL  Comprehensive metabolic panel     Status: None   Collection Time: 08/16/21  8:55 AM  Result Value Ref Range   Glucose 99 70 - 99 mg/dL   BUN 15 6 - 24 mg/dL   Creatinine, Ser 0.71 0.57 - 1.00 mg/dL   eGFR 100 >59 mL/min/1.73   BUN/Creatinine Ratio 21 9 - 23   Sodium 140 134 - 144 mmol/L   Potassium 4.3 3.5 - 5.2 mmol/L   Chloride 101 96 - 106 mmol/L   CO2 24 20 - 29 mmol/L   Calcium 9.1 8.7 - 10.2 mg/dL   Total Protein 7.1 6.0 - 8.5 g/dL   Albumin 4.5 3.8 - 4.9 g/dL   Globulin, Total 2.6 1.5 - 4.5 g/dL   Albumin/Globulin Ratio 1.7 1.2 - 2.2   Bilirubin Total 0.3 0.0 - 1.2 mg/dL   Alkaline Phosphatase 112 44 - 121 IU/L   AST 17 0 - 40 IU/L   ALT 19 0 - 32 IU/L  Creatinine, urine, 24 hour     Status: None   Collection Time: 08/25/21 10:00 AM  Result Value Ref Range   Creatinine, Urine 85.7 Not Estab. mg/dL   Creatinine, 24H Ur 1,200 800 - 1,800 mg/24 hr  Metanephrines, urine, 24 hour     Status: None   Collection Time: 08/25/21  2:00 PM  Result  Value Ref Range   Normetanephrine, Ur 295 Undefined ug/L   Normetanephrine, 24H Ur 413 131 - 612 ug/24 hr   Metaneph Total, Ur 73 Undefined ug/L   Metanephrines, 24H Ur 102 36 - 209 ug/24 hr  Catecholamines, fractionated, urine, 24 hour     Status: None   Collection Time: 08/25/21  2:00 PM  Result Value Ref Range   Epinephrine, Rand Ur 4 Undefined ug/L   Epinephrine, 24H Ur 6 0 - 20 ug/24 hr   Norepinephrine, Rand Ur 63 Undefined ug/L   Norepinephrine, 24H Ur 88 0 - 135 ug/24 hr   Dopamine, Rand Ur 227 Undefined ug/L   Dopamine , 24H Ur 318 0 - 510 ug/24 hr  Cortisol, urine, free     Status: None   Collection Time: 08/25/21  2:00 PM  Result Value Ref Range   Cortisol,F,ug/L,U 24 Undefined ug/L   Cortisol (Ur), Free 34 6 - 42 ug/24 hr  HgB A1c     Status: None   Collection Time: 09/13/21 11:57 AM  Result Value Ref Range   Hemoglobin A1C     HbA1c POC (<> result, manual entry)     HbA1c, POC (prediabetic range)     HbA1c, POC (controlled diabetic range) 5.8 0.0 - 7.0 %     Assessment & Plan:   1. Adrenal adenoma, left   I discussed her labs with her.  She has a nonfunctioning left adrenal adenoma.  She would not need intervention for this at this time.   The radiologist description of this nodule favors adenoma  which will not need surgical intervention at this time.   In light of her presentation consistent with metabolic syndrome including elevated blood pressure, prediabetes, and severe dyslipidemia, obesity, this patient will benefit from lifestyle medicine.  She is not on statins, evidently hesitant.    - she acknowledges that there is a room for improvement in her food and drink choices. - Suggestion is made for her to avoid simple carbohydrates  from her diet including Cakes, Sweet Desserts, Ice Cream, Soda (diet and regular), Sweet Tea, Candies, Chips, Cookies, Store Bought Juices, Alcohol , Artificial Sweeteners,  Coffee Creamer, and "Sugar-free" Products, Lemonade.  This will help patient to have more stable blood glucose profile and potentially avoid unintended weight gain.  The following Lifestyle Medicine recommendations according to [place]  [facility]) were discussed and and offered to patient and she  agrees to start the journey:  A. Whole Foods, Plant-Based Nutrition comprising of fruits and vegetables, plant-based proteins, whole-grain carbohydrates was discussed in detail with the patient.   A list for source of those nutrients were also provided to the patient.  Patient will use only water or unsweetened tea for hydration. B.  The need to stay away from risky substances including alcohol, smoking; obtaining 7 to 9 hours of restorative sleep, at least 150 minutes of moderate intensity exercise weekly, the importance of healthy social connections,  and stress management techniques were discussed. C.  A full color page of  Calorie density of various food groups per pound showing examples of each food groups was provided to the patient. Her point-of-care A1c is 5.8%, consistent with prediabetes.   - she is advised to maintain close follow up with Kathyrn Drown, MD for primary care needs.   I spent 31 minutes in the care of the patient today including review of labs from Thyroid Function, CMP, and other relevant labs ; imaging/biopsy records (current and previous including abstractions from other facilities); face-to-face time discussing  her lab results and symptoms, medications doses, her options of short and long term treatment based on the latest standards of care / guidelines;   and documenting the encounter. Risk reduction counseling performed per USPSTF guidelines to reduce obesity and cardiovascular risk factors.    Gardiner Rhyme Engram  participated in the discussions, expressed understanding, and voiced agreement with the above plans.  All questions were answered to her satisfaction. she is encouraged to contact clinic should  she have any questions or concerns prior to her return visit.   Follow up plan: Return in about 4 months (around 01/13/2022) for Fasting Labs  in AM B4 8, A1c -NV.   Glade Lloyd, MD [facility] [address] [place], [place] [address] Phone: [phone]  Fax: [phone]     09/13/2021, 4:42 PM  This note was partially dictated with voice recognition software. Similar sounding words can be transcribed inadequately or may not  be corrected upon review.

## 2021-09-13 NOTE — Patient Instructions (Signed)

## 2021-09-25 ENCOUNTER — Encounter: Payer: Self-pay | Admitting: Family Medicine

## 2021-09-25 MED ORDER — FLUOXETINE HCL 20 MG PO CAPS: ORAL_CAPSULE | ORAL | 0 refills | Status: DC

## 2021-09-25 NOTE — Telephone Encounter (Signed)
Prozac 20 mg, 1 daily, #90  Also please have Khori send me a message regarding in what way are things not going well so I can have a little more clear picture.  Trying to make sure she is not having severe depression or being suicidal.  This will help [place] to know when to recommend follow-up thank you  Thanks-Dr. Nicki Reaper

## 2021-10-11 ENCOUNTER — Encounter: Payer: Self-pay | Admitting: Family Medicine

## 2021-12-22 ENCOUNTER — Other Ambulatory Visit: Payer: Self-pay | Admitting: *Deleted

## 2021-12-22 DIAGNOSIS — R911 Solitary pulmonary nodule: Secondary | ICD-10-CM

## 2021-12-29 ENCOUNTER — Encounter: Payer: Self-pay | Admitting: Nurse Practitioner

## 2022-01-03 ENCOUNTER — Other Ambulatory Visit: Payer: Self-pay | Admitting: Family Medicine

## 2022-01-04 NOTE — Telephone Encounter (Signed)
Needs follow-up office visit May have 90-day

## 2022-01-16 ENCOUNTER — Ambulatory Visit (HOSPITAL_COMMUNITY): Payer: BC Managed Care – PPO

## 2022-01-17 ENCOUNTER — Ambulatory Visit: Payer: BC Managed Care – PPO | Admitting: "Endocrinology

## 2022-01-18 ENCOUNTER — Ambulatory Visit: Payer: BC Managed Care – PPO | Admitting: "Endocrinology

## 2022-01-24 ENCOUNTER — Encounter: Payer: Self-pay | Admitting: Nurse Practitioner

## 2022-01-24 ENCOUNTER — Ambulatory Visit: Payer: BC Managed Care – PPO | Admitting: Nurse Practitioner

## 2022-01-24 VITALS — BP 141/88 | Ht 65.0 in | Wt 200.0 lb

## 2022-01-24 DIAGNOSIS — F17200 Nicotine dependence, unspecified, uncomplicated: Secondary | ICD-10-CM | POA: Diagnosis not present

## 2022-01-24 MED ORDER — NICOTINE 14 MG/24HR TD PT24: 14.0000 mg | MEDICATED_PATCH | Freq: Every day | TRANSDERMAL | 1 refills | Status: DC

## 2022-01-24 NOTE — Progress Notes (Signed)
   Subjective:    Patient ID: Suzanne Hart, female    DOB: [birth year], 56 y.o.   MRN: [id number]  HPI  56 year old female patient with history of tobacco use presents to clinic today with desire to quit smoking.  Patient states that she has been smoking since she was 56 years old (approximately 40 years).  Patient states that she currently smokes 4 to 5 cigarettes a day and has been doing this for about a year.  Patient states that she typically smokes a cigarette in the morning, for lunch, and after work.    Patient believes that nicotine patch will help her to stop smoking altogether.  Patient states that a friend use the nicotine patch and was successful.    Patient admits to adequate stress management and does not believe that stress levels will impede her ability to quit smoking.    Patient has no other concerns today.  Review of Systems  All other systems reviewed and are negative.      Objective:   Physical Exam Vitals reviewed.  Constitutional:      General: She is not in acute distress.    Appearance: Normal appearance. She is obese. She is not ill-appearing, toxic-appearing or diaphoretic.  HENT:     Head: Normocephalic and atraumatic.  Cardiovascular:     Rate and Rhythm: Normal rate and regular rhythm.     Pulses: Normal pulses.     Heart sounds: Normal heart sounds. No murmur heard. Pulmonary:     Effort: Pulmonary effort is normal. No respiratory distress.     Breath sounds: Normal breath sounds. No wheezing.  Musculoskeletal:     Comments: Grossly intact  Skin:    General: Skin is warm.     Capillary Refill: Capillary refill takes less than 2 seconds.  Neurological:     Mental Status: She is alert.     Comments: Grossly intact  Psychiatric:        Mood and Affect: Mood normal.        Behavior: Behavior normal.           Assessment & Plan:   1. Smoking -Patient to trial nicotine 14 mg patch (patient smokes less than 10 cigarettes a day). -In 6 to 8  weeks patient may be ready to decrease patch to 7 mg. - nicotine (NICODERM CQ - DOSED IN MG/24 HOURS) 14 mg/24hr patch; Place 1 patch (14 mg total) onto the skin daily.  Dispense: 28 patch; Refill: 1 -Patient to follow-up with PCP in 6 to 8 weeks to discuss progress.    Note:  This document was prepared using Dragon voice recognition software and may include unintentional dictation errors. Note - This record has been created using [employer].  Chart creation errors have been sought, but may not always  have been located. Such creation errors do not reflect on  the standard of medical care.

## 2022-01-30 ENCOUNTER — Ambulatory Visit
Admission: RE | Admit: 2022-01-30 | Discharge: 2022-01-30 | Disposition: A | Discharge status: Home / Self Care | Payer: BC Managed Care – PPO | Source: Ambulatory Visit

## 2022-01-30 VITALS — BP 145/93 | HR 81 | Temp 98.2°F | Resp 16

## 2022-01-30 DIAGNOSIS — M545 Low back pain, unspecified: Secondary | ICD-10-CM

## 2022-01-30 MED ORDER — TIZANIDINE HCL 4 MG PO TABS: 4.0000 mg | ORAL_TABLET | Freq: Four times a day (QID) | ORAL | 0 refills | Status: DC | PRN

## 2022-01-30 MED ORDER — IBUPROFEN 800 MG PO TABS: 800.0000 mg | ORAL_TABLET | Freq: Three times a day (TID) | ORAL | 0 refills | Status: DC | PRN

## 2022-01-30 NOTE — ED Provider Notes (Signed)
[facility]    CSN: [id number] Arrival date & time: 01/30/22  1040      History   Chief Complaint Chief Complaint  Patient presents with   Back Pain    Entered by patient   Appointment    1100    HPI Suzanne Hart is a 56 y.o. female.   Patient presents for back pain that started suddenly yesterday when she woke up.  Denies recent trauma, accident, fall, or heavy lifting/pushing pulling the past couple of days.  Reports the pain is severe and describes the pain as "locked up".  Reports the pain does not radiate down either leg.  Has used Aleve/Advil for the pain with mild relief.  Denies numbness or tingling, saddle anesthesia, bowel/bladder incontinence, fevers, and dysuria/urinary frequency.  Endorses nausea when she tries to walk.  Pain is worse with standing.  No weakness, falling, decreased sensation in her legs.      Past Medical History:  Diagnosis Date   Chronic headache    Chronic joint pain    GERD (gastroesophageal reflux disease)    H/O migraine    Iron deficiency anemia 2004   Secondary menorrhagia    Patient Active Problem List   Diagnosis Date Noted   Prediabetes 09/13/2021   Adrenal adenoma, left 08/16/2021   Elevated blood pressure reading 08/16/2021   Class 1 obesity due to excess calories with serious comorbidity and body mass index (BMI) of 33.0 to 33.9 in adult 08/16/2021   Current smoker 08/16/2021   Mixed hyperlipidemia 11/05/2020   Diarrhea of presumed infectious origin 07/18/2020   Anxiety, generalized 05/22/2016   Dyspepsia 12/18/2012   Rectal bleeding 11/24/2012   Menopause 11/24/2012    Past Surgical History:  Procedure Laterality Date   ABDOMINAL HYSTERECTOMY     2005   COLONOSCOPY N/A 01/16/2013   Dr. Fields:Normal mucosa in the terminal ileum/Five COLON POLYPS REMOVED/RECTAL BLEEDING DUE TO Moderate sized internal hemorrhoids. hyperplastic polyps   ESOPHAGOGASTRODUODENOSCOPY N/A 01/16/2013   Dr. Gillie Manners  DUE TO MILD GASTRITIS/DUODENITIS AND POSSIBLY GERD/small [place]      OB History   No obstetric history on file.      Home Medications    Prior to Admission medications   Medication Sig Start Date End Date Taking? Authorizing Provider  ibuprofen (ADVIL) 800 MG tablet Take 1 tablet (800 mg total) by mouth every 8 (eight) hours as needed. Take with food to prevent GI upset 01/30/22  Yes Noemi Chapel A, NP  tiZANidine (ZANAFLEX) 4 MG tablet Take 1 tablet (4 mg total) by mouth every 6 (six) hours as needed for muscle spasms. Do not take with alcohol or while driving or operating heavy machinery.  May cause drowsiness. 01/30/22  Yes Eulogio Bear, NP  FLUoxetine (PROZAC) 20 MG capsule TAKE ONE CAPSULE BY MOUTH DAILY 01/04/22   Kathyrn Drown, MD  Multiple Vitamin (MULTIVITAMIN ADULT PO) Take 1 tablet by mouth daily.    [provider]  nicotine (NICODERM CQ - DOSED IN MG/24 HOURS) 14 mg/24hr patch Place 1 patch (14 mg total) onto the skin daily. 01/24/22   [employer], [place]on Gammon, FNP    Family History Family History  Problem Relation Age of Onset   COPD Mother    Mitral valve prolapse Paternal Aunt    Congestive Heart Failure Paternal Grandfather    Colon cancer Maternal Grandmother    Breast cancer Maternal Aunt     Social History Social History  Tobacco Use   Smoking status: Every Day    Packs/day: 15.00    Years: 0.50    Total pack years: 7.50    Types: Cigarettes   Smokeless tobacco: Never  Vaping Use   Vaping Use: Never used  Substance Use Topics   Alcohol use: No    Alcohol/week: 0.0 standard drinks of alcohol   Drug use: No     Allergies   Zoloft [sertraline]   Review of Systems Review of Systems Per HPI  Physical Exam Triage Vital Signs ED Triage Vitals  Enc Vitals Group     BP 01/30/22 1155 (!) 145/93     Pulse Rate 01/30/22 1155 81     Resp 01/30/22 1155 16     Temp 01/30/22 1155 98.2 F (36.8 C)     Temp Source  01/30/22 1155 Oral     SpO2 01/30/22 1155 98 %     Weight --      Height --      Head Circumference --      Peak Flow --      Pain Score 01/30/22 1154 10     Pain Loc --      Pain Edu? --      Excl. in [place]? --    No data found.  Updated Vital Signs BP (!) 145/93 (BP Location: Right Arm)   Pulse 81   Temp 98.2 F (36.8 C) (Oral)   Resp 16   SpO2 98%   Visual Acuity Right Eye Distance:   Left Eye Distance:   Bilateral Distance:    Right Eye Near:   Left Eye Near:    Bilateral Near:     Physical Exam Vitals and nursing note reviewed.  Constitutional:      General: She is not in acute distress.    Appearance: Normal appearance. She is not toxic-appearing.  HENT:     Mouth/Throat:     Mouth: Mucous membranes are moist.     Pharynx: Oropharynx is clear. No oropharyngeal exudate or posterior oropharyngeal erythema.  Pulmonary:     Effort: Pulmonary effort is normal. No respiratory distress.  Musculoskeletal:       Back:     Right foot: Normal range of motion and normal capillary refill. No swelling, tenderness or bony tenderness. Normal pulse.     Left foot: Normal range of motion and normal capillary refill. No swelling, tenderness or bony tenderness. Normal pulse.     Comments: Inspection: no swelling, obvious deformity, or redness to low back Palpation: low back tender to palpation in areas marked;  no obvious deformity palpate ROM: Full ROM to low back, lumbar spine Strength: 5/5 bilateral lower extremities Neurovascular: neurovascularly intact in left and right lower extremity    Skin:    General: Skin is warm and dry.     Capillary Refill: Capillary refill takes less than 2 seconds.     Coloration: Skin is not jaundiced or pale.     Findings: No erythema.  Neurological:     Mental Status: She is alert and oriented to person, place, and time.  Psychiatric:        Behavior: Behavior is cooperative.      UC Treatments / Results  Labs (all labs ordered are  listed, but only abnormal results are displayed) Labs Reviewed - No data to display  EKG   Radiology No results found.  Procedures Procedures (including critical care time)  Medications Ordered in UC Medications - No data  to display  Initial Impression / Assessment and Plan / UC Course  I have reviewed the triage vital signs and the nursing notes.  Pertinent labs & imaging results that were available during my care of the patient were reviewed by me and considered in my medical decision making (see chart for details).   Patient is well-appearing, normotensive, afebrile, not tachycardic, not tachypneic, oxygenating well on room air.    Acute bilateral low back pain without sciatica Suspect musculoskeletal cause No red flags in history or examination today Treat with NSAIDs, muscle relaxant Discussed light stretching/range of motion exercises Given for work ER and return precautions discussed  The patient was given the opportunity to ask questions.  All questions answered to their satisfaction.  The patient is in agreement to this plan.    Final Clinical Impressions(s) / UC Diagnoses   Final diagnoses:  Acute bilateral low back pain without sciatica     Discharge Instructions      Start ibuprofen 800 mg every 8 hours with food instead of Aleve/Advil to help with inflammation in the back.  You can also take tizanidine every 6 hours as needed for muscular pain.  Start light stretching/range of motion exercises.    If pain has not improved after 1-2 weeks, follow up with PCP.     ED Prescriptions     Medication Sig Dispense Auth. Provider   ibuprofen (ADVIL) 800 MG tablet Take 1 tablet (800 mg total) by mouth every 8 (eight) hours as needed. Take with food to prevent GI upset 21 tablet Noemi Chapel A, NP   tiZANidine (ZANAFLEX) 4 MG tablet Take 1 tablet (4 mg total) by mouth every 6 (six) hours as needed for muscle spasms. Do not take with alcohol or while driving  or operating heavy machinery.  May cause drowsiness. 30 tablet Eulogio Bear, NP      PDMP not reviewed this encounter.   Eulogio Bear, NP 01/30/22 [phone]

## 2022-01-30 NOTE — ED Triage Notes (Signed)
Pt reports lower back pain x 1 day. Pain is worse when try to stand up and walk; improve whne sitting. Aleve and Advil gives some relief.

## 2022-01-30 NOTE — Discharge Instructions (Addendum)
Start ibuprofen 800 mg every 8 hours with food instead of Aleve/Advil to help with inflammation in the back.  You can also take tizanidine every 6 hours as needed for muscular pain.  Start light stretching/range of motion exercises.    If pain has not improved after 1-2 weeks, follow up with PCP.

## 2022-02-20 ENCOUNTER — Encounter (HOSPITAL_COMMUNITY): Payer: Self-pay

## 2022-02-20 ENCOUNTER — Ambulatory Visit (HOSPITAL_COMMUNITY): Admission: RE | Admit: 2022-02-20 | Payer: BC Managed Care – PPO | Source: Ambulatory Visit

## 2022-04-28 IMAGING — CT CT CHEST W/O CM
2 of 4 series · 15 of 36 positions shown, 18 images · non-contrast
Comparison: Chest radiographs done on 05/24/2021

CLINICAL DATA: Abdominal chest x-ray findings



[Series 2: routine chest without · axial · non-contrast · 0.74mm/px · z∈[+1376,+1614]mm · 12 of 141 slices shown, 15 images]
[im 11/141  mediastinal]
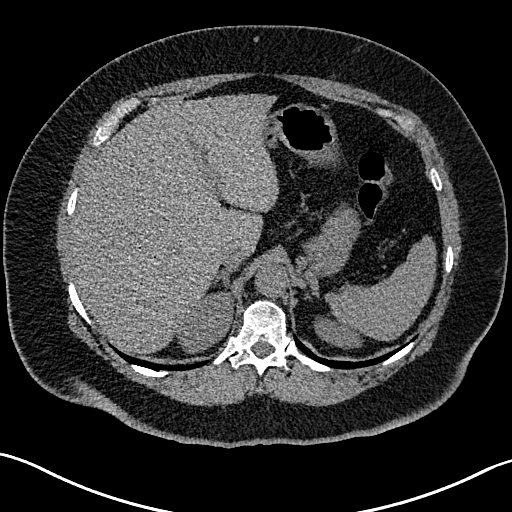
[im 11/141  lung]
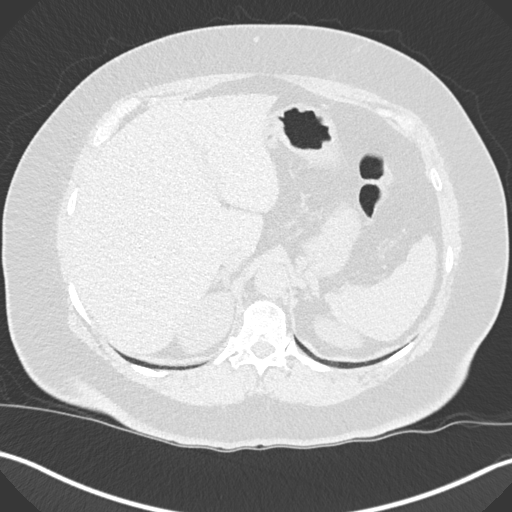
[im 22/141  lung]
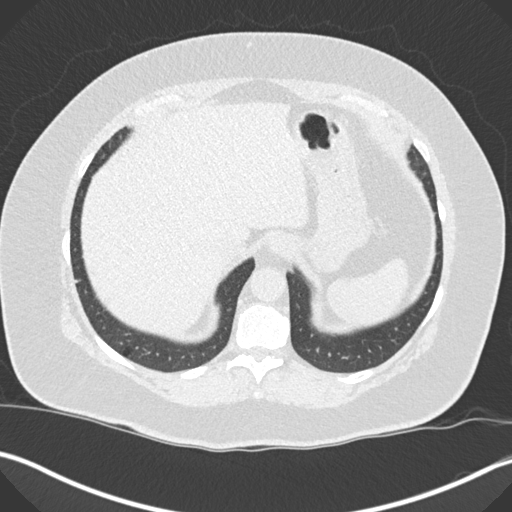
[im 33/141  lung]
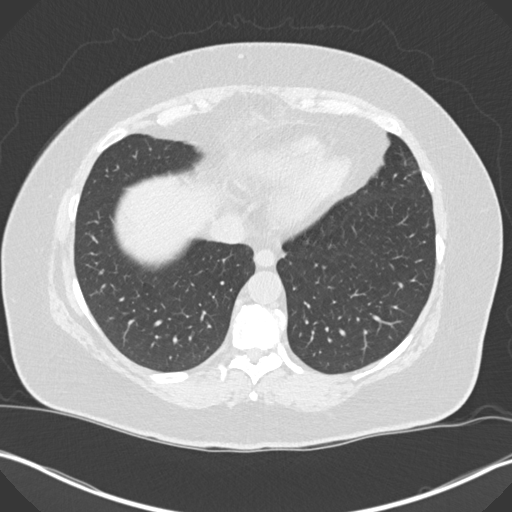
[im 44/141  lung]
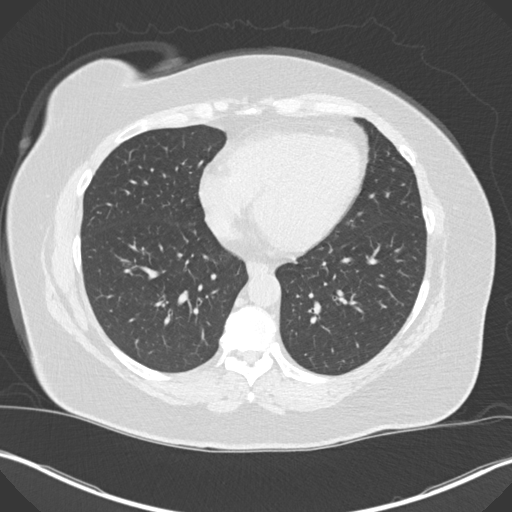
[im 54/141  mediastinal]
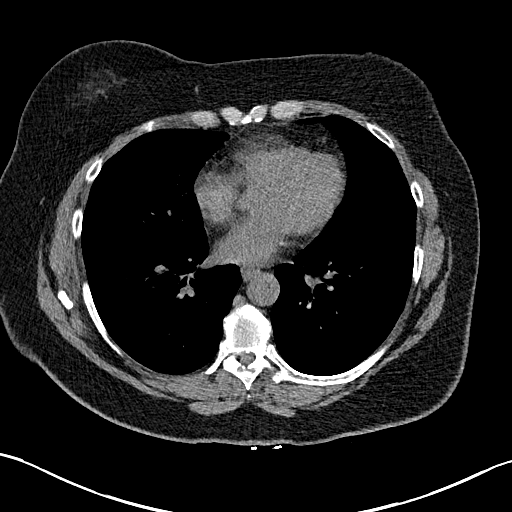
[im 54/141  lung]
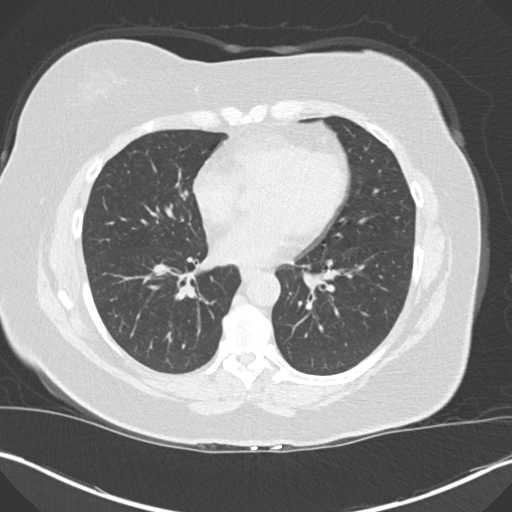
[im 65/141  lung]
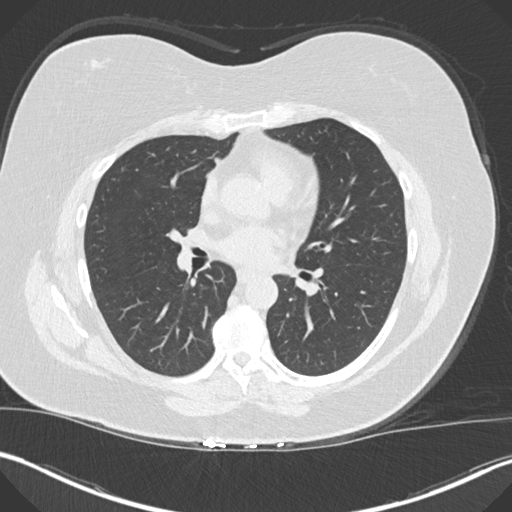
[im 76/141  lung]
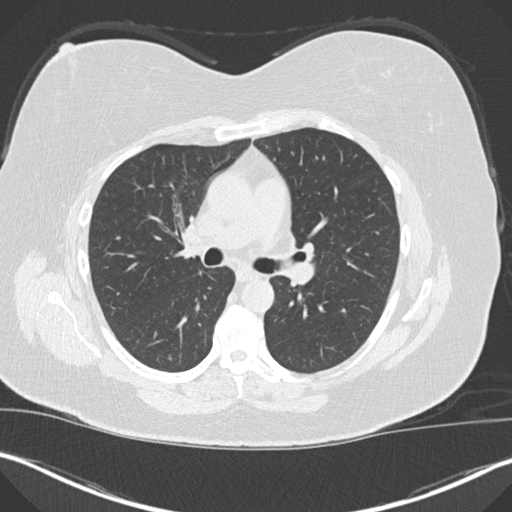
[im 87/141  lung]
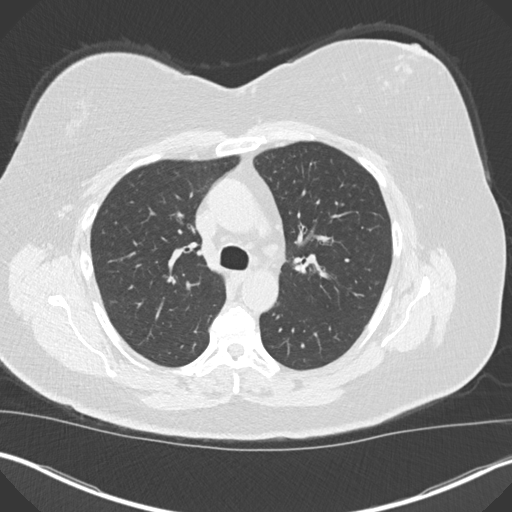
[im 97/141  mediastinal]
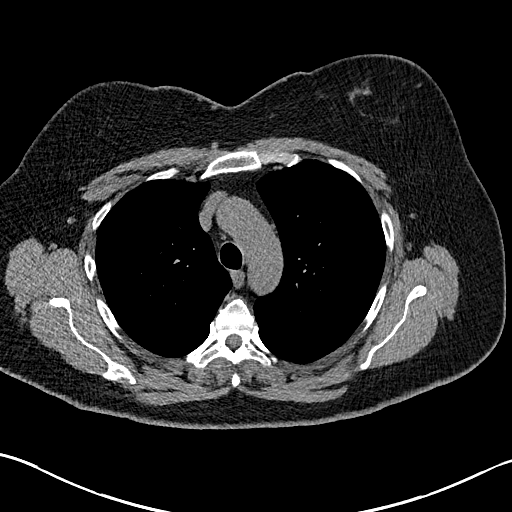
[im 97/141  lung]
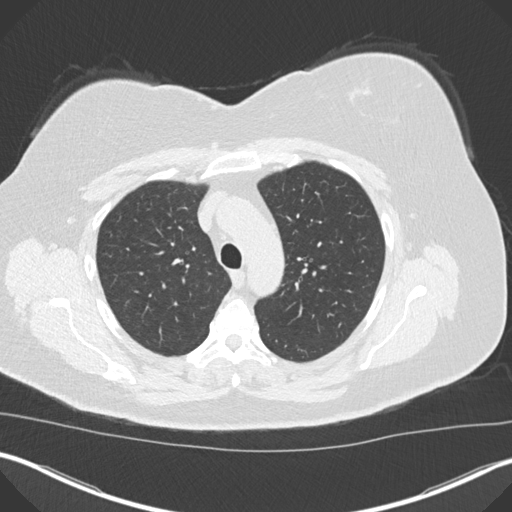
[im 108/141  lung]
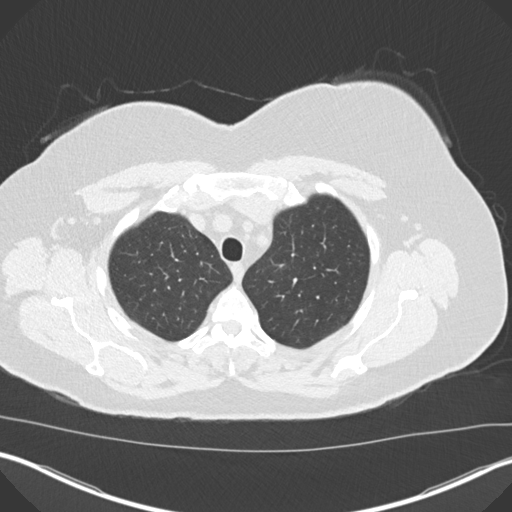
[im 119/141  lung]
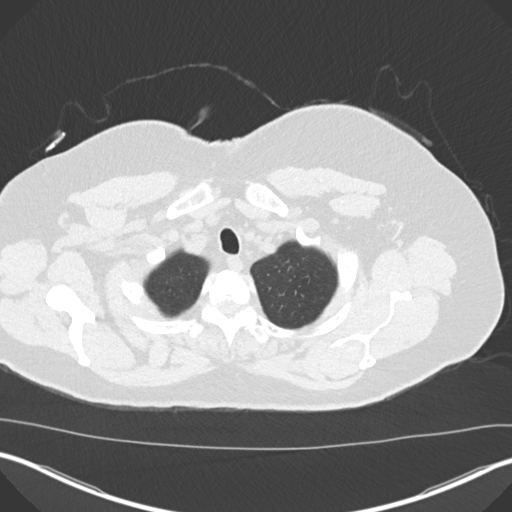
[im 130/141  lung]
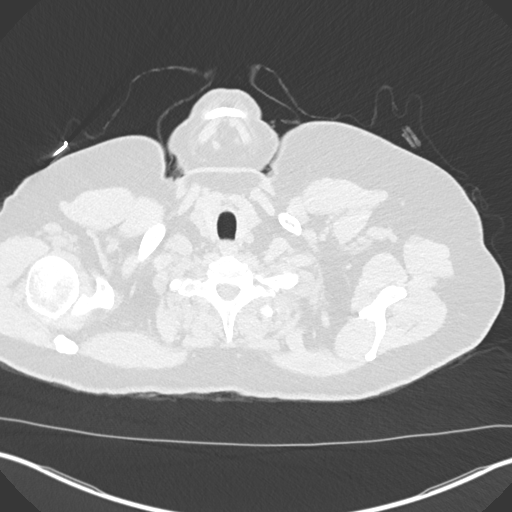

[Series 5: coronal · coronal · 0.59mm/px · 3 of 167 slices shown]
[im 34/167  lung]
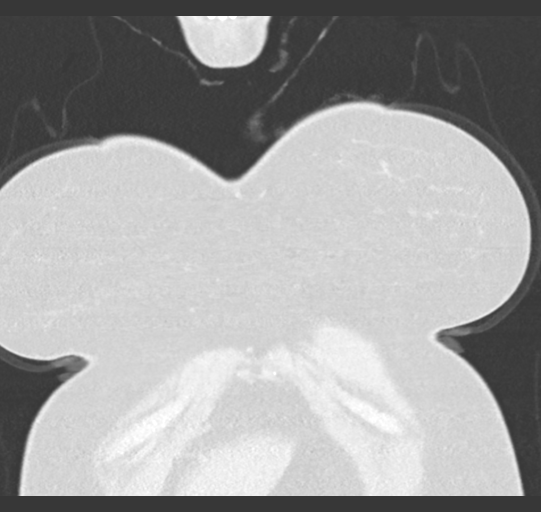
[im 67/167  lung]
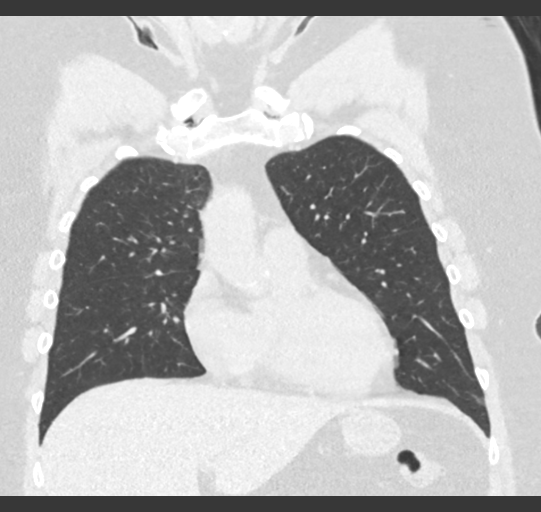
[im 100/167  lung]
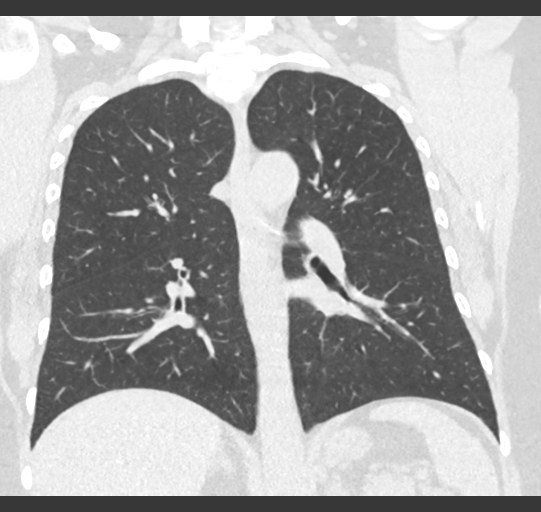

[15 of 36 positions shown; findings below may reference images not displayed]

FINDINGS: Cardiovascular: Unremarkable.

Mediastinum/Nodes: There are subcentimeter nodes in the mediastinum,
possibly suggesting reactive hyperplasia.

Lungs/Pleura: There are no focal pulmonary infiltrates. There are no
discrete parenchymal lung nodules. Small linear densities seen in
the periphery of both lower lung fields suggesting minimal scarring.
In image 78 of series 4, there is 6 x 3 mm pleural-based nodule in
the right upper lobe. There is no pleural effusion or pneumothorax.

Upper Abdomen: There is 10 mm nodule in the left adrenal. Density
measurements are less than 0 Hounsfield units suggesting possible
adenoma.

Musculoskeletal: Degenerative changes are noted with bony spurs in
the thoracic spine.
IMPRESSION: There is no focal pulmonary consolidation. No discrete lung nodules
are seen in the lower lung fields. Finding seen in the previous
chest radiographs done on [DATE] [DATE] [DATE] have been caused by
confluence of normal soft tissue structures.

There is a 6 x 3 mm pleural-based nodular density in the right upper
lobe. Non-contrast chest CT at 6-12 months is recommended. If the
nodule is stable at time of repeat CT, then future CT at 18-24
months (from today's scan) is considered optional for low-risk
patients, but is recommended for high-risk patients. This
recommendation follows the consensus statement: Guidelines for
Management of Incidental Pulmonary Nodules Detected on CT Images:

10 mm low-density nodule in the left adrenal may suggest adenoma.

## 2022-05-27 ENCOUNTER — Inpatient Hospital Stay: Admission: RE | Admit: 2022-05-27 | Payer: Self-pay | Source: Ambulatory Visit

## 2022-07-23 ENCOUNTER — Other Ambulatory Visit: Payer: Self-pay | Admitting: Family Medicine

## 2022-07-30 MED ORDER — FLUOXETINE HCL 20 MG PO CAPS: ORAL_CAPSULE | ORAL | 0 refills | Status: DC

## 2022-11-22 ENCOUNTER — Ambulatory Visit: Payer: BC Managed Care – PPO | Admitting: Nurse Practitioner

## 2022-11-22 VITALS — BP 118/72 | HR 64 | Temp 98.2°F | Ht 65.0 in | Wt 197.8 lb

## 2022-11-22 DIAGNOSIS — F32A Depression, unspecified: Secondary | ICD-10-CM | POA: Diagnosis not present

## 2022-11-22 DIAGNOSIS — K219 Gastro-esophageal reflux disease without esophagitis: Secondary | ICD-10-CM | POA: Diagnosis not present

## 2022-11-22 DIAGNOSIS — F419 Anxiety disorder, unspecified: Secondary | ICD-10-CM | POA: Diagnosis not present

## 2022-11-22 DIAGNOSIS — K582 Mixed irritable bowel syndrome: Secondary | ICD-10-CM | POA: Diagnosis not present

## 2022-11-22 MED ORDER — PANTOPRAZOLE SODIUM 40 MG PO TBEC: 40.0000 mg | DELAYED_RELEASE_TABLET | Freq: Two times a day (BID) | ORAL | 0 refills | Status: DC

## 2022-11-22 NOTE — Progress Notes (Unsigned)
Subjective:    Patient ID: Suzanne Hart, female    DOB: 08/28/1965, 57 y.o.   MRN: [id number]  HPI Pt comes in today for stomach issues and nausea, that started getting worse on Thursday 11/15/2022. Pt states that the burning sensation hs eased up but pain and nausea has not, comes whenever she eats but no symptoms when she has not ate after a while. Pepcid has helped slightly. Last Thursday night patient began having intense symptoms including burning across the upper abdomen.  Lasted all day Friday into the weekend.  Has eased up some.  No fever.  Having alternating cycles of diarrhea and constipation.  Stools normal color.  Nausea but no vomiting.  No relief with Tums Pepcid or Gas-X.  Has been under major stress for the past few weeks due to her husband's recent injury 3 weeks ago.  History of GERD in the past.  Has been taking her Prozac every other day to avoid a flat affect, states this is working well.  Has decreased her smoking to half pack per day.  Limited caffeine intake.  Limited citrus or spicy foods.  No excessive NSAID use.  Review of Systems  Constitutional:  Positive for fatigue. Negative for fever.  Respiratory:  Positive for chest tightness. Negative for cough, shortness of breath and wheezing.        Chest tightness only with reflux symptoms, unassociated with activity.  Cardiovascular:  Negative for chest pain.  Gastrointestinal:  Positive for abdominal pain, constipation, diarrhea and nausea. Negative for blood in stool and vomiting.      11/22/2022    9:15 AM  Depression screen PHQ 2/9  Decreased Interest 2  Down, Depressed, Hopeless 1  PHQ - 2 Score 3  Altered sleeping 2  Tired, decreased energy 2  Change in appetite 2  Feeling bad or failure about yourself  0  Trouble concentrating 2  Moving slowly or fidgety/restless 0  Suicidal thoughts 0  PHQ-9 Score 11  Difficult doing work/chores Somewhat difficult      11/22/2022    9:15 AM 01/24/2022    8:40 AM  10/21/2020   12:45 PM  GAD 7 : Generalized Anxiety Score  Nervous, Anxious, on Edge 2 1 3   Control/stop worrying 1 1 1   Worry too much - different things 2 1 1   Trouble relaxing 2 2 3   Restless 1 2 1   Easily annoyed or irritable 2 2 3   Afraid - awful might happen  1 0  Total GAD 7 Score  10 12  Anxiety Difficulty Somewhat difficult Somewhat difficult Somewhat difficult         Objective:   Physical Exam NAD.  Alert, oriented.  Mildly anxious affect.  Making good eye contact.  Speech clear.  Dressed appropriately for the weather.  Thoughts logical coherent and relevant.  Lungs clear.  Heart regular rate rhythm.  Abdomen soft nondistended with distinct epigastric area tenderness especially upper epigastric area.  No rebound or guarding.  No obvious masses. Today's Vitals   11/22/22 0908  BP: 118/72  Pulse: 64  Temp: 98.2 F (36.8 C)  SpO2: 99%  Weight: 197 lb 12.8 oz (89.7 kg)  Height: 5\' 5"  (1.651 m)   Body mass index is 32.92 kg/m.        Assessment & Plan:   Problem List Items Addressed This Visit       Digestive   Gastroesophageal reflux disease without esophagitis - Primary   Relevant Medications  pantoprazole (PROTONIX) 40 MG tablet   Irritable bowel syndrome with both constipation and diarrhea   Relevant Medications   pantoprazole (PROTONIX) 40 MG tablet     Other   Anxiety and depression   Meds ordered this encounter  Medications   pantoprazole (PROTONIX) 40 MG tablet    Sig: Take 1 tablet (40 mg total) by mouth 2 (two) times daily before a meal.    Dispense:  60 tablet    Refill:  0    Order Specific Question:   Supervising Provider    Answer:   Lilyan Punt A [[id number]]  Continue Prozac as directed.  Defers changes in medication at this time. Start Protonix 40 mg twice daily for the next couple of weeks.  If symptoms have improved cut back to daily use.  Reviewed lifestyle factors affecting her GERD and IBS including diet and stress. Warning signs  reviewed.  Call back in 2 to 3 weeks if no improvement in symptoms, sooner if worse.

## 2022-11-23 ENCOUNTER — Encounter: Payer: Self-pay | Admitting: Nurse Practitioner

## 2022-11-23 DIAGNOSIS — F419 Anxiety disorder, unspecified: Secondary | ICD-10-CM | POA: Insufficient documentation

## 2022-12-27 ENCOUNTER — Telehealth: Payer: BC Managed Care – PPO | Admitting: Nurse Practitioner

## 2022-12-27 DIAGNOSIS — F17210 Nicotine dependence, cigarettes, uncomplicated: Secondary | ICD-10-CM | POA: Diagnosis not present

## 2022-12-27 DIAGNOSIS — F17200 Nicotine dependence, unspecified, uncomplicated: Secondary | ICD-10-CM

## 2022-12-28 ENCOUNTER — Encounter: Payer: Self-pay | Admitting: Nurse Practitioner

## 2022-12-28 NOTE — Progress Notes (Signed)
Virtual Visit via Video Note  I connected with Suzanne Hart on 12/27/22 at 11:20 AM EDT by a video enabled telemedicine application and verified that I am speaking with the correct person using two identifiers.  Location: Patient: car Provider: office   I discussed the limitations of evaluation and management by telemedicine and the availability of in person appointments. The patient expressed understanding and agreed to proceed.  History of Present Illness: Presents for her yearly checkup on tobacco use.  Has been smoking since she was 57 years old.  Currently smoking the same amount, about 5 cigarettes/day.  Last year she tried patches which did not help.  Her maximal amount of smoking at 1 time was 1 pack/day.  Has done some modifications to help occupy her mind such as crocheting to keep her smoking at a minimum.  Denies any vaping.  Doing well on current dose of fluoxetine.  This is helping manage her stress. Denies chest pain, shortness of breath or wheezing.  Has completed other requirements for insurance for tobacco cessation.   Observations/Objective: NAD.  Alert, oriented.  Calm cheerful affect.  No tachypnea or signs of respiratory distress.  Assessment and Plan: Problem List Items Addressed This Visit       Other   Current smoker - Primary   Discussed the use of Chantix.  Patient will consider this and get back with [place]. Continue to limit smoking and use distraction techniques.     Follow Up Instructions: Recheck in 1 year per her insurance requirements for smoking cessation.   I discussed the assessment and treatment plan with the patient. The patient was provided an opportunity to ask questions and all were answered. The patient agreed with the plan and demonstrated an understanding of the instructions.   The patient was advised to call back or seek an in-person evaluation if the symptoms worsen or if the condition fails to improve as anticipated.  I provided 15  minutes of non-face-to-face time during this encounter.   Campbell Riches, NP

## 2023-01-02 ENCOUNTER — Encounter: Payer: Self-pay | Admitting: *Deleted

## 2023-01-13 ENCOUNTER — Encounter: Payer: Self-pay | Admitting: Nurse Practitioner

## 2023-01-31 ENCOUNTER — Ambulatory Visit
Admission: RE | Admit: 2023-01-31 | Discharge: 2023-01-31 | Disposition: A | Discharge status: Home / Self Care | Payer: BC Managed Care – PPO | Source: Ambulatory Visit | Attending: Nurse Practitioner | Admitting: Nurse Practitioner

## 2023-01-31 VITALS — BP 133/73 | HR 59 | Temp 98.4°F | Resp 16

## 2023-01-31 DIAGNOSIS — J0120 Acute ethmoidal sinusitis, unspecified: Secondary | ICD-10-CM | POA: Diagnosis not present

## 2023-01-31 MED ORDER — AMOXICILLIN-POT CLAVULANATE 875-125 MG PO TABS: 1.0000 | ORAL_TABLET | Freq: Two times a day (BID) | ORAL | 0 refills | Status: AC

## 2023-01-31 NOTE — Discharge Instructions (Addendum)
You have a sinus infection and early left ear infection.  Take the Augmentin as prescribed to treat it.  Some things that can make you feel better are: - Increased rest - Increasing fluid with water/sugar free electrolytes - Acetaminophen and ibuprofen as needed for fever/pain - OTC guaifenesin (Mucinex) 600 mg twice daily for nasal congestion - Saline sinus flushes or a neti pot for congestion - Humidifying the air

## 2023-01-31 NOTE — ED Provider Notes (Signed)
[facility]    CSN: [id number] Arrival date & time: 01/31/23  6213      History   Chief Complaint Chief Complaint  Patient presents with   sinus pressure    HPI Suzanne Hart is a 57 y.o. female.   Patient presents today with 2 day history of sinus pressure, nasal pain, nasal congestion with thick clear drainage, headache, and left ear popping.  Reports she woke up this morning with severe pain over the bridge of her nose and difficulty blowing her nose due to the pain.  No fever, body aches, or chills.  No cough, chest pain, or shortness of breath.  Denies abdominal pain, nausea/vomiting, diarrhea, and change in appetite.  She endorses decreased energy levels since symptoms began.  Has been taking Advil for the pain with temporary relief of the headache.    Past Medical History:  Diagnosis Date   Chronic headache    Chronic joint pain    GERD (gastroesophageal reflux disease)    H/O migraine    Iron deficiency anemia 2004   Secondary menorrhagia    Patient Active Problem List   Diagnosis Date Noted   Anxiety and depression 11/23/2022   Gastroesophageal reflux disease without esophagitis 11/22/2022   Irritable bowel syndrome with both constipation and diarrhea 11/22/2022   Prediabetes 09/13/2021   Adrenal adenoma, left 08/16/2021   Elevated blood pressure reading 08/16/2021   Class 1 obesity due to excess calories with serious comorbidity and body mass index (BMI) of 33.0 to 33.9 in adult 08/16/2021   Current smoker 08/16/2021   Mixed hyperlipidemia 11/05/2020   Diarrhea of presumed infectious origin 07/18/2020   Anxiety, generalized 05/22/2016   Dyspepsia 12/18/2012   Rectal bleeding 11/24/2012   Menopause 11/24/2012    Past Surgical History:  Procedure Laterality Date   ABDOMINAL HYSTERECTOMY     2005   COLONOSCOPY N/A 01/16/2013   Dr. Fields:Normal mucosa in the terminal ileum/Five COLON POLYPS REMOVED/RECTAL BLEEDING DUE TO Moderate sized  internal hemorrhoids. hyperplastic polyps   ESOPHAGOGASTRODUODENOSCOPY N/A 01/16/2013   Dr. Rudi Coco DUE TO MILD GASTRITIS/DUODENITIS AND POSSIBLY GERD/small HH   TUBAL LIGATION      OB History   No obstetric history on file.      Home Medications    Prior to Admission medications   Medication Sig Start Date End Date Taking? Authorizing Provider  amoxicillin-clavulanate (AUGMENTIN) 875-125 MG tablet Take 1 tablet by mouth 2 (two) times daily for 7 days. 01/31/23 02/07/23 Yes Valentino Nose, NP  FLUoxetine (PROZAC) 20 MG capsule TAKE ONE CAPSULE BY MOUTH DAILY 07/30/22   Babs Sciara, MD  pantoprazole (PROTONIX) 40 MG tablet Take 1 tablet (40 mg total) by mouth 2 (two) times daily before a meal. 11/22/22   Campbell Riches, NP    Family History Family History  Problem Relation Age of Onset   COPD Mother    Mitral valve prolapse Paternal Aunt    Congestive Heart Failure Paternal Grandfather    Colon cancer Maternal Grandmother    Breast cancer Maternal Aunt     Social History Social History   Tobacco Use   Smoking status: Every Day    Current packs/day: 15.00    Average packs/day: 15.0 packs/day for 0.5 years (7.5 ttl pk-yrs)    Types: Cigarettes   Smokeless tobacco: Never  Vaping Use   Vaping status: Never Used  Substance Use Topics   Alcohol use: No    Alcohol/week: 0.0 standard drinks of  alcohol   Drug use: No     Allergies   Zoloft [sertraline]   Review of Systems Review of Systems Per HPI  Physical Exam Triage Vital Signs ED Triage Vitals  Encounter Vitals Group     BP 01/31/23 1025 133/73     Systolic BP Percentile --      Diastolic BP Percentile --      Pulse Rate 01/31/23 1025 (!) 59     Resp 01/31/23 1025 16     Temp 01/31/23 1025 98.4 F (36.9 C)     Temp Source 01/31/23 1025 Oral     SpO2 01/31/23 1025 98 %     Weight --      Height --      Head Circumference --      Peak Flow --      Pain Score 01/31/23 1024 5      Pain Loc --      Pain Education --      Exclude from Growth Chart --    No data found.  Updated Vital Signs BP 133/73 (BP Location: Right Arm)   Pulse (!) 59   Temp 98.4 F (36.9 C) (Oral)   Resp 16   SpO2 98%   Visual Acuity Right Eye Distance:   Left Eye Distance:   Bilateral Distance:    Right Eye Near:   Left Eye Near:    Bilateral Near:     Physical Exam Vitals and nursing note reviewed.  Constitutional:      General: She is not in acute distress.    Appearance: Normal appearance. She is not ill-appearing or toxic-appearing.  HENT:     Head: Normocephalic and atraumatic.     Right Ear: Tympanic membrane, ear canal and external ear normal.     Left Ear: Ear canal and external ear normal. Tympanic membrane is erythematous.     Nose: No congestion or rhinorrhea.     Left Turbinates: Enlarged and swollen.     Right Sinus: No maxillary sinus tenderness or frontal sinus tenderness.     Left Sinus: No maxillary sinus tenderness or frontal sinus tenderness.     Comments: +ethmoidal tenderness bilaterally    Mouth/Throat:     Mouth: Mucous membranes are moist.     Pharynx: Oropharynx is clear. No oropharyngeal exudate or posterior oropharyngeal erythema.  Eyes:     General: No scleral icterus.    Extraocular Movements: Extraocular movements intact.  Cardiovascular:     Rate and Rhythm: Normal rate and regular rhythm.  Pulmonary:     Effort: Pulmonary effort is normal. No respiratory distress.     Breath sounds: Normal breath sounds. No wheezing, rhonchi or rales.  Musculoskeletal:     Cervical back: Normal range of motion and neck supple.  Lymphadenopathy:     Cervical: No cervical adenopathy.  Skin:    General: Skin is warm and dry.     Capillary Refill: Capillary refill takes less than 2 seconds.     Coloration: Skin is not jaundiced or pale.     Findings: No erythema or rash.  Neurological:     Mental Status: She is alert and oriented to person, place, and  time.  Psychiatric:        Behavior: Behavior is cooperative.      UC Treatments / Results  Labs (all labs ordered are listed, but only abnormal results are displayed) Labs Reviewed - No data to display  EKG   Radiology No  results found.  Procedures Procedures (including critical care time)  Medications Ordered in UC Medications - No data to display  Initial Impression / Assessment and Plan / UC Course  I have reviewed the triage vital signs and the nursing notes.  Pertinent labs & imaging results that were available during my care of the patient were reviewed by me and considered in my medical decision making (see chart for details).   Patient is well-appearing, normotensive, afebrile, not tachycardic, not tachypneic, oxygenating well on room air.    1. Acute non-recurrent ethmoidal sinusitis Treat with Augmentin twice daily for 7 days  Other supportive care discussed including Mucinex, nasal saline spray and rinses Return precautions discussed  The patient was given the opportunity to ask questions.  All questions answered to their satisfaction.  The patient is in agreement to this plan.   Final Clinical Impressions(s) / UC Diagnoses   Final diagnoses:  Acute non-recurrent ethmoidal sinusitis     Discharge Instructions      You have a sinus infection and early left ear infection.  Take the Augmentin as prescribed to treat it.  Some things that can make you feel better are: - Increased rest - Increasing fluid with water/sugar free electrolytes - Acetaminophen and ibuprofen as needed for fever/pain - OTC guaifenesin (Mucinex) 600 mg twice daily for nasal congestion - Saline sinus flushes or a neti pot for congestion - Humidifying the air     ED Prescriptions     Medication Sig Dispense Auth. Provider   amoxicillin-clavulanate (AUGMENTIN) 875-125 MG tablet Take 1 tablet by mouth 2 (two) times daily for 7 days. 14 tablet Valentino Nose, NP       PDMP not reviewed this encounter.   Valentino Nose, NP 01/31/23 1105

## 2023-01-31 NOTE — ED Triage Notes (Signed)
Pt reports sinus pressure and nasal pain and pressure, and eyes burning x 2 days

## 2023-02-25 ENCOUNTER — Ambulatory Visit
Admission: RE | Admit: 2023-02-25 | Discharge: 2023-02-25 | Disposition: A | Discharge status: Home / Self Care | Payer: BC Managed Care – PPO | Source: Ambulatory Visit | Attending: Family Medicine

## 2023-02-25 ENCOUNTER — Ambulatory Visit: Payer: BC Managed Care – PPO

## 2023-02-25 ENCOUNTER — Telehealth: Payer: Self-pay

## 2023-02-25 VITALS — BP 135/86 | HR 75 | Temp 98.0°F | Resp 18

## 2023-02-25 DIAGNOSIS — R062 Wheezing: Secondary | ICD-10-CM

## 2023-02-25 DIAGNOSIS — R051 Acute cough: Secondary | ICD-10-CM | POA: Diagnosis not present

## 2023-02-25 MED ORDER — PREDNISONE 20 MG PO TABS: 40.0000 mg | ORAL_TABLET | Freq: Every day | ORAL | 0 refills | Status: DC

## 2023-02-25 MED ORDER — IPRATROPIUM-ALBUTEROL 0.5-2.5 (3) MG/3ML IN SOLN
3.0000 mL | Freq: Once | RESPIRATORY_TRACT | Status: AC
  Administered 2023-02-25: 3 mL via RESPIRATORY_TRACT

## 2023-02-25 MED ORDER — ALBUTEROL SULFATE HFA 108 (90 BASE) MCG/ACT IN AERS: 2.0000 | INHALATION_SPRAY | RESPIRATORY_TRACT | 0 refills | Status: DC | PRN

## 2023-02-25 MED ORDER — PROMETHAZINE-DM 6.25-15 MG/5ML PO SYRP: 5.0000 mL | ORAL_SOLUTION | Freq: Four times a day (QID) | ORAL | 0 refills | Status: DC | PRN

## 2023-02-25 NOTE — Discharge Instructions (Signed)
We will call once her chest x-ray report is back and discuss a plan from there

## 2023-02-25 NOTE — ED Provider Notes (Signed)
[facility]    CSN: [id number] Arrival date & time: 02/25/23  1242      History   Chief Complaint Chief Complaint  Patient presents with   Cough    Sinus congestion, tightness in chest - Entered by patient    HPI Suzanne Hart is a 57 y.o. female.   Patient presenting today with 4-day history of cough, wheezing, chest tightness, nasal congestion and sinus pressure.  Denies fever, chest pain, significant shortness of breath, abdominal pain, nausea vomiting or diarrhea.  Trying Mucinex, Tylenol and saline rinses with no relief.  No known chronic pulmonary disease but is a chronic smoker.    Past Medical History:  Diagnosis Date   Chronic headache    Chronic joint pain    GERD (gastroesophageal reflux disease)    H/O migraine    Iron deficiency anemia 2004   Secondary menorrhagia    Patient Active Problem List   Diagnosis Date Noted   Anxiety and depression 11/23/2022   Gastroesophageal reflux disease without esophagitis 11/22/2022   Irritable bowel syndrome with both constipation and diarrhea 11/22/2022   Prediabetes 09/13/2021   Adrenal adenoma, left 08/16/2021   Elevated blood pressure reading 08/16/2021   Class 1 obesity due to excess calories with serious comorbidity and body mass index (BMI) of 33.0 to 33.9 in adult 08/16/2021   Current smoker 08/16/2021   Mixed hyperlipidemia 11/05/2020   Diarrhea of presumed infectious origin 07/18/2020   Anxiety, generalized 05/22/2016   Dyspepsia 12/18/2012   Rectal bleeding 11/24/2012   Menopause 11/24/2012    Past Surgical History:  Procedure Laterality Date   ABDOMINAL HYSTERECTOMY     2005   COLONOSCOPY N/A 01/16/2013   Dr. Fields:Normal mucosa in the terminal ileum/Five COLON POLYPS REMOVED/RECTAL BLEEDING DUE TO Moderate sized internal hemorrhoids. hyperplastic polyps   ESOPHAGOGASTRODUODENOSCOPY N/A 01/16/2013   Dr. Rudi Coco DUE TO MILD GASTRITIS/DUODENITIS AND POSSIBLY GERD/small HH   TUBAL  LIGATION      OB History   No obstetric history on file.      Home Medications    Prior to Admission medications   Medication Sig Start Date End Date Taking? Authorizing Provider  albuterol (VENTOLIN HFA) 108 (90 Base) MCG/ACT inhaler Inhale 2 puffs into the lungs every 4 (four) hours as needed. 02/25/23   Particia Nearing, PA-C  FLUoxetine (PROZAC) 20 MG capsule TAKE ONE CAPSULE BY MOUTH DAILY Patient not taking: Reported on 02/25/2023 07/30/22   Babs Sciara, MD  pantoprazole (PROTONIX) 40 MG tablet Take 1 tablet (40 mg total) by mouth 2 (two) times daily before a meal. 11/22/22   Campbell Riches, NP  predniSONE (DELTASONE) 20 MG tablet Take 2 tablets (40 mg total) by mouth daily with breakfast. 02/25/23   Particia Nearing, PA-C  promethazine-dextromethorphan (PROMETHAZINE-DM) 6.25-15 MG/5ML syrup Take 5 mLs by mouth 4 (four) times daily as needed. 02/25/23   Particia Nearing, PA-C    Family History Family History  Problem Relation Age of Onset   COPD Mother    Mitral valve prolapse Paternal Aunt    Congestive Heart Failure Paternal Grandfather    Colon cancer Maternal Grandmother    Breast cancer Maternal Aunt     Social History Social History   Tobacco Use   Smoking status: Every Day    Current packs/day: 15.00    Average packs/day: 15.0 packs/day for 0.5 years (7.5 ttl pk-yrs)    Types: Cigarettes   Smokeless tobacco: Never  Vaping Use  Vaping status: Never Used  Substance Use Topics   Alcohol use: No    Alcohol/week: 0.0 standard drinks of alcohol   Drug use: No     Allergies   Zoloft [sertraline]   Review of Systems Review of Systems Per HPI  Physical Exam Triage Vital Signs ED Triage Vitals  Encounter Vitals Group     BP 02/25/23 1325 135/86     Systolic BP Percentile --      Diastolic BP Percentile --      Pulse Rate 02/25/23 1325 75     Resp 02/25/23 1325 18     Temp 02/25/23 1325 98 F (36.7 C)     Temp Source 02/25/23  1325 Oral     SpO2 02/25/23 1325 95 %     Weight --      Height --      Head Circumference --      Peak Flow --      Pain Score 02/25/23 1326 0     Pain Loc --      Pain Education --      Exclude from Growth Chart --    No data found.  Updated Vital Signs BP 135/86 (BP Location: Right Arm)   Pulse 75   Temp 98 F (36.7 C) (Oral)   Resp 18   SpO2 95%   Visual Acuity Right Eye Distance:   Left Eye Distance:   Bilateral Distance:    Right Eye Near:   Left Eye Near:    Bilateral Near:     Physical Exam Vitals and nursing note reviewed.  Constitutional:      Appearance: Normal appearance.  HENT:     Head: Atraumatic.     Right Ear: Tympanic membrane and external ear normal.     Left Ear: Tympanic membrane and external ear normal.     Nose: Rhinorrhea present.     Mouth/Throat:     Mouth: Mucous membranes are moist.     Pharynx: Posterior oropharyngeal erythema present.  Eyes:     Extraocular Movements: Extraocular movements intact.     Conjunctiva/sclera: Conjunctivae normal.  Cardiovascular:     Rate and Rhythm: Normal rate and regular rhythm.     Heart sounds: Normal heart sounds.  Pulmonary:     Effort: Pulmonary effort is normal.     Breath sounds: Wheezing present. No rales.  Musculoskeletal:        General: Normal range of motion.     Cervical back: Normal range of motion and neck supple.  Skin:    General: Skin is warm and dry.  Neurological:     Mental Status: She is alert and oriented to person, place, and time.  Psychiatric:        Mood and Affect: Mood normal.        Thought Content: Thought content normal.      UC Treatments / Results  Labs (all labs ordered are listed, but only abnormal results are displayed) Labs Reviewed - No data to display  EKG   Radiology DG Chest 2 View  Result Date: 02/25/2023 CLINICAL DATA:  Productive cough and wheezing.  Chest tightness. EXAM: CHEST - 2 VIEW COMPARISON:  Chest radiograph dated 05/24/2021.  FINDINGS: The heart size and mediastinal contours are within normal limits. Both lungs are clear. The visualized skeletal structures are unremarkable. IMPRESSION: No active cardiopulmonary disease. Electronically Signed   By: Elgie Collard M.D.   On: 02/25/2023 15:20    Procedures Procedures (  including critical care time)  Medications Ordered in UC Medications  ipratropium-albuterol (DUONEB) 0.5-2.5 (3) MG/3ML nebulizer solution 3 mL (3 mLs Nebulization Given 02/25/23 1346)    Initial Impression / Assessment and Plan / UC Course  I have reviewed the triage vital signs and the nursing notes.  Pertinent labs & imaging results that were available during my care of the patient were reviewed by me and considered in my medical decision making (see chart for details).     Vitals and exam overall reassuring today, significant improvement in breath sounds after DuoNeb treatment and patient reports symptomatic benefit additionally.  Chest x-ray today negative for pneumonia.  Treat for bronchitis with prednisone, albuterol, Phenergan DM, supportive over-the-counter medications and home care.  Work note given.  Return for worsening symptoms.  Final Clinical Impressions(s) / UC Diagnoses   Final diagnoses:  Acute cough  Wheezing     Discharge Instructions      We will call once her chest x-ray report is back and discuss a plan from there    ED Prescriptions   None    PDMP not reviewed this encounter.   Particia Nearing, [place] 02/25/23 1826

## 2023-02-25 NOTE — Telephone Encounter (Signed)
Medication sent in to treat bronchitis.  Patient notified.

## 2023-02-25 NOTE — ED Triage Notes (Signed)
Cough, sinus pressure and pain with chest tightness that started Friday. Taking mucinex, tylenol and saline rinse with no relief of symptoms.

## 2023-02-25 NOTE — Telephone Encounter (Signed)
Contacted pt to inform her of x-ray results and treatment plan recommended by provider pt has verbalized understanding and is agreeable to plan. Has requested that medications be sent to [facility]

## 2023-03-05 ENCOUNTER — Encounter: Payer: Self-pay | Admitting: Nurse Practitioner

## 2023-03-05 ENCOUNTER — Ambulatory Visit: Payer: BC Managed Care – PPO | Admitting: Nurse Practitioner

## 2023-03-05 ENCOUNTER — Other Ambulatory Visit: Payer: Self-pay | Admitting: Nurse Practitioner

## 2023-03-05 VITALS — BP 126/84 | HR 83 | Temp 98.5°F | Ht 65.0 in | Wt 202.2 lb

## 2023-03-05 DIAGNOSIS — S46912A Strain of unspecified muscle, fascia and tendon at shoulder and upper arm level, left arm, initial encounter: Secondary | ICD-10-CM

## 2023-03-05 DIAGNOSIS — M62838 Other muscle spasm: Secondary | ICD-10-CM | POA: Diagnosis not present

## 2023-03-05 MED ORDER — CHLORZOXAZONE 500 MG PO TABS: 500.0000 mg | ORAL_TABLET | Freq: Three times a day (TID) | ORAL | 0 refills | Status: DC | PRN

## 2023-03-05 MED ORDER — TIZANIDINE HCL 4 MG PO TABS: 4.0000 mg | ORAL_TABLET | Freq: Four times a day (QID) | ORAL | 0 refills | Status: DC | PRN

## 2023-03-05 MED ORDER — NAPROXEN 500 MG PO TABS: 500.0000 mg | ORAL_TABLET | Freq: Two times a day (BID) | ORAL | 0 refills | Status: DC

## 2023-03-05 NOTE — Progress Notes (Signed)
   Subjective:    Patient ID: Suzanne Hart, female    DOB: May 26, 1965, 57 y.o.   MRN: [id number]  HPI Presents for complaints of muscle soreness and tightness in the left upper arm into the shoulder/upper back area.  Begins above the elbow.  Is gotten worse as the week is gone on, feels like a squeezing or pressure.  Has had chronic weakness in her hands bilaterally, no change.  No history of injury.  Better with movement.  Worse with inactivity.  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  Uses the mouse with her right hand.   Review of Systems  Respiratory:  Negative for cough, chest tightness and shortness of breath.   Cardiovascular:  Negative for chest pain.  Musculoskeletal:  Positive for back pain, myalgias and neck pain.       Objective:   Physical Exam NAD.  Alert, oriented.  Lungs clear.  Heart regular rate rhythm.  Normal ROM of the neck and shoulders with no limitation noted.  Hand strength equal and 4+ bilaterally.  Strong radial pulses with normal capillary refill in the fingers.  Extremely tight tender muscles noted in the lateral neck area more on the left.  Also tenderness and tight muscles noted in the upper rhomboids left side.  Tenderness with palpation of the anterior shoulder joint line into the triceps/biceps.  Normal ROM of the elbow and wrist without tenderness. Today's Vitals   03/05/23 1016  BP: 126/84  Pulse: 83  Temp: 98.5 F (36.9 C)  TempSrc: Oral  SpO2: 99%  Weight: 202 lb 3.2 oz (91.7 kg)  Height: 5\' 5"  (1.651 m)   Body mass index is 33.65 kg/m.        Assessment & Plan:   Problem List Items Addressed This Visit       Musculoskeletal and Integument   Muscle spasms of neck - Primary   Muscle strain of left upper arm   Meds ordered this encounter  Medications   naproxen (NAPROSYN) 500 MG tablet    Sig: Take 1 tablet (500 mg total) by mouth 2 (two) times daily with a meal. Prn pain    Dispense:  30 tablet     Refill:  0    Supervising Provider:   Lilyan Punt A [[id number]]   tiZANidine (ZANAFLEX) 4 MG tablet    Sig: Take 1 tablet (4 mg total) by mouth every 6 (six) hours as needed for muscle spasms.    Dispense:  30 tablet    Refill:  0    Supervising Provider:   Lilyan Punt A [[id number]]   Start naproxen and tizanidine for symptoms.  Recommend ice/heat applications restart TENS unit as directed and regular stretching.  Also consider massage therapy.  Feel the muscle spasms and tightness is most likely related to her job.  Call back if symptoms worsen or persist.

## 2023-04-03 ENCOUNTER — Encounter: Payer: Self-pay | Admitting: Family Medicine

## 2023-04-03 NOTE — Telephone Encounter (Signed)
 Nurses I am certainly sympathetic to what is going on with Suzanne Hart  Please send [name] a message that it would be best that we go ahead and schedule him an office visit-you have my permission to fit him in within the next 2 weeks  At that visit we will discuss how things go on it would be highly important for [name] to attend at that same visit  We will do initial testing at that visit as well as set up some additional testing and there is a possibility we will also have to set up some consults at that visit  Please help coordinate scheduling of this visit and communication to [place]

## 2023-04-30 ENCOUNTER — Telehealth: Payer: 59 | Admitting: Physician Assistant

## 2023-04-30 DIAGNOSIS — J111 Influenza due to unidentified influenza virus with other respiratory manifestations: Secondary | ICD-10-CM | POA: Diagnosis not present

## 2023-04-30 MED ORDER — BENZONATATE 100 MG PO CAPS: 100.0000 mg | ORAL_CAPSULE | Freq: Three times a day (TID) | ORAL | 0 refills | Status: DC | PRN

## 2023-04-30 MED ORDER — OSELTAMIVIR PHOSPHATE 75 MG PO CAPS: 75.0000 mg | ORAL_CAPSULE | Freq: Two times a day (BID) | ORAL | 0 refills | Status: AC

## 2023-04-30 NOTE — Progress Notes (Signed)
Virtual Visit Consent   Syra Sirmons Swiderski, you are scheduled for a virtual visit with a [facility] provider today. Just as with appointments in the office, your consent must be obtained to participate. Your consent will be active for this visit and any virtual visit you may have with one of our providers in the next 365 days. If you have a MyChart account, a copy of this consent can be sent to you electronically.  As this is a virtual visit, video technology does not allow for your provider to perform a traditional examination. This may limit your provider's ability to fully assess your condition. If your provider identifies any concerns that need to be evaluated in person or the need to arrange testing (such as labs, EKG, etc.), we will make arrangements to do so. Although advances in technology are sophisticated, we cannot ensure that it will always work on either your end or our end. If the connection with a video visit is poor, the visit may have to be switched to a telephone visit. With either a video or telephone visit, we are not always able to ensure that we have a secure connection.  By engaging in this virtual visit, you consent to the provision of healthcare and authorize for your insurance to be billed (if applicable) for the services provided during this visit. Depending on your insurance coverage, you may receive a charge related to this service.  I need to obtain your verbal consent now. Are you willing to proceed with your visit today? Suzanne Hart has provided verbal consent on 04/30/2023 for a virtual visit (video or telephone). [name], [place]  Date: 04/30/2023 10:30 AM   Virtual Visit via Video Note   I, [name], connected with  Suzanne Hart  ([id number], [birth year]) on 04/30/23 at 10:30 AM EST by a video-enabled telemedicine application and verified that I am speaking with the correct person using two identifiers.  Location: Patient: Virtual Visit Location  Patient: Home Provider: Virtual Visit Location Provider: Home Office   I discussed the limitations of evaluation and management by telemedicine and the availability of in person appointments. The patient expressed understanding and agreed to proceed.    History of Present Illness: Suzanne Hart is a 58 y.o. who identifies as a female who was assigned female at birth, and is being seen today for abrupt onset of body aches and fatigue yesterday followed quickly by headache, chills, mild cough. Overnight noted chest tenderness with some productive cough. Temperature this AM 99.5 but on Tylenol so unsure of fever.   HPI: HPI  Problems:  Patient Active Problem List   Diagnosis Date Noted   Muscle spasms of neck 03/05/2023   Muscle strain of left upper arm 03/05/2023   Anxiety and depression 11/23/2022   Gastroesophageal reflux disease without esophagitis 11/22/2022   Irritable bowel syndrome with both constipation and diarrhea 11/22/2022   Prediabetes 09/13/2021   Adrenal adenoma, left 08/16/2021   Elevated blood pressure reading 08/16/2021   Class 1 obesity due to excess calories with serious comorbidity and body mass index (BMI) of 33.0 to 33.9 in adult 08/16/2021   Current smoker 08/16/2021   Mixed hyperlipidemia 11/05/2020   Diarrhea of presumed infectious origin 07/18/2020   Anxiety, generalized 05/22/2016   Dyspepsia 12/18/2012   Rectal bleeding 11/24/2012   Menopause 11/24/2012    Allergies:  Allergies  Allergen Reactions   Zoloft [Sertraline]     Restless legs could not sleep  Medications:  Current Outpatient Medications:    benzonatate (TESSALON) 100 MG capsule, Take 1 capsule (100 mg total) by mouth 3 (three) times daily as needed for cough., Disp: 30 capsule, Rfl: 0   oseltamivir (TAMIFLU) 75 MG capsule, Take 1 capsule (75 mg total) by mouth 2 (two) times daily for 5 days., Disp: 10 capsule, Rfl: 0   albuterol (VENTOLIN HFA) 108 (90 Base) MCG/ACT inhaler, Inhale 2 puffs  into the lungs every 4 (four) hours as needed., Disp: 18 g, Rfl: 0   chlorzoxazone (PARAFON) 500 MG tablet, Take 1 tablet (500 mg total) by mouth 3 (three) times daily as needed for muscle spasms., Disp: 30 tablet, Rfl: 0   naproxen (NAPROSYN) 500 MG tablet, Take 1 tablet (500 mg total) by mouth 2 (two) times daily with a meal. Prn pain, Disp: 30 tablet, Rfl: 0   pantoprazole (PROTONIX) 40 MG tablet, Take 1 tablet (40 mg total) by mouth 2 (two) times daily before a meal., Disp: 60 tablet, Rfl: 0  Observations/Objective: Patient is well-developed, well-nourished in no acute distress.  Resting comfortably at home.  Head is normocephalic, atraumatic.  No labored breathing. Speech is clear and coherent with logical content.  Patient is alert and oriented at baseline.   Assessment and Plan: 1. Influenza (Primary) - benzonatate (TESSALON) 100 MG capsule; Take 1 capsule (100 mg total) by mouth 3 (three) times daily as needed for cough.  Dispense: 30 capsule; Refill: 0 - oseltamivir (TAMIFLU) 75 MG capsule; Take 1 capsule (75 mg total) by mouth 2 (two) times daily for 5 days.  Dispense: 10 capsule; Refill: 0  Classic influenza symptoms. Known exposure. Supportive measures, OTC medications and Vitamin recommendations reviewed. Will start Tamiflu per orders. Tessalon per orders. Quarantine reviewed with patient.    Follow Up Instructions: I discussed the assessment and treatment plan with the patient. The patient was provided an opportunity to ask questions and all were answered. The patient agreed with the plan and demonstrated an understanding of the instructions.  A copy of instructions were sent to the patient via MyChart unless otherwise noted below.   The patient was advised to call back or seek an in-person evaluation if the symptoms worsen or if the condition fails to improve as anticipated.    [name], PA-C

## 2023-04-30 NOTE — Patient Instructions (Signed)
Suzanne Hart, thank you for joining [name], PA-C for today's virtual visit.  While this provider is not your primary care provider (PCP), if your PCP is located in our provider database this encounter information will be shared with them immediately following your visit.   A [facility] MyChart account gives you access to today's visit and all your visits, tests, and labs performed at [facility] " click here if you don't have a [facility] MyChart account or go to [email or web address]  Consent: (Patient) Suzanne Hart provided verbal consent for this virtual visit at the beginning of the encounter.  Current Medications:  Current Outpatient Medications:    albuterol (VENTOLIN HFA) 108 (90 Base) MCG/ACT inhaler, Inhale 2 puffs into the lungs every 4 (four) hours as needed., Disp: 18 g, Rfl: 0   chlorzoxazone (PARAFON) 500 MG tablet, Take 1 tablet (500 mg total) by mouth 3 (three) times daily as needed for muscle spasms., Disp: 30 tablet, Rfl: 0   naproxen (NAPROSYN) 500 MG tablet, Take 1 tablet (500 mg total) by mouth 2 (two) times daily with a meal. Prn pain, Disp: 30 tablet, Rfl: 0   pantoprazole (PROTONIX) 40 MG tablet, Take 1 tablet (40 mg total) by mouth 2 (two) times daily before a meal., Disp: 60 tablet, Rfl: 0   promethazine-dextromethorphan (PROMETHAZINE-DM) 6.25-15 MG/5ML syrup, Take 5 mLs by mouth 4 (four) times daily as needed., Disp: 100 mL, Rfl: 0   Medications ordered in this encounter:  No orders of the defined types were placed in this encounter.    *If you need refills on other medications prior to your next appointment, please contact your pharmacy*  Follow-Up: Call back or seek an in-person evaluation if the symptoms worsen or if the condition fails to improve as anticipated.  [facility] [phone]  Other Instructions Please keep well-hydrated and try to get plenty of rest. If you have a humidifier, place it in the  bedroom and run it at night. Start a saline nasal rinse for nasal congestion. You can consider use of a nasal steroid spray like Flonase or Nasacort OTC. You can alternate between Tylenol and Ibuprofen if needed for fever, body aches, headache and/or throat pain. Salt water-gargles and chloraseptic spray can be very beneficial for sore throat. Mucinex-DM for congestion or cough. Please take all prescribed medications as directed.  Remain out of work until [employer] for 24 hours without a fever-reducing medication, and you are feeling better.  You should mask until symptoms are resolved.  If anything worsens despite treatment, you need to be evaluated in-person. Please do not delay care.  Influenza, Adult Influenza is also called "the flu." It is an infection in the lungs, nose, and throat (respiratory tract). It spreads easily from person to person (is contagious). The flu causes symptoms that are like a cold, along with high fever and body aches. What are the causes? This condition is caused by the influenza virus. You can get the virus by: Breathing in droplets that are in the air after a person infected with the flu coughed or sneezed. Touching something that has the virus on it and then touching your mouth, nose, or eyes. What increases the risk? Certain things may make you more likely to get the flu. These include: Not washing your hands often. Having close contact with many people during cold and flu season. Touching your mouth, eyes, or nose without first washing your hands. Not getting a flu shot every  year. You may have a higher risk for the flu, and serious problems, such as a lung infection (pneumonia), if you: Are older than 65. Are pregnant. Have a weakened disease-fighting system (immune system) because of a disease or because you are taking certain medicines. Have a long-term (chronic) condition, such as: Heart, kidney, or lung disease. Diabetes. Asthma. Have a liver  disorder. Are very overweight (morbidly obese). Have anemia. What are the signs or symptoms? Symptoms usually begin suddenly and last 4-14 days. They may include: Fever and chills. Headaches, body aches, or muscle aches. Sore throat. Cough. Runny or stuffy (congested) nose. Feeling discomfort in your chest. Not wanting to eat as much as normal. Feeling weak or tired. Feeling dizzy. Feeling sick to your stomach or throwing up. How is this treated? If the flu is found early, you can be treated with antiviral medicine. This can help to reduce how bad the illness is and how long it lasts. This may be given by mouth or through an IV tube. Taking care of yourself at home can help your symptoms get better. Your doctor may want you to: Take over-the-counter medicines. Drink plenty of fluids. The flu often goes away on its own. If you have very bad symptoms or other problems, you may be treated in a hospital. Follow these instructions at home:     Activity Rest as needed. Get plenty of sleep. Stay home from work or school as told by your doctor. Do not leave home until you do not have a fever for 24 hours without taking medicine. Leave home only to go to your doctor. Eating and drinking Take an ORS (oral rehydration solution). This is a drink that is sold at pharmacies and stores. Drink enough fluid to keep your pee pale yellow. Drink clear fluids in small amounts as you are able. Clear fluids include: Water. Ice chips. Fruit juice mixed with water. Low-calorie sports drinks. Eat bland foods that are easy to digest. Eat small amounts as you are able. These foods include: Bananas. Applesauce. Rice. Lean meats. Toast. Crackers. Do not eat or drink: Fluids that have a lot of sugar or caffeine. Alcohol. Spicy or fatty foods. General instructions Take over-the-counter and prescription medicines only as told by your doctor. Use a cool mist humidifier to add moisture to the air in  your home. This can make it easier for you to breathe. When using a cool mist humidifier, clean it daily. Empty water and replace with clean water. Cover your mouth and nose when you cough or sneeze. Wash your hands with soap and water often and for at least 20 seconds. This is also important after you cough or sneeze. If you cannot use soap and water, use alcohol-based hand sanitizer. Keep all follow-up visits. How is this prevented?  Get a flu shot every year. You may get the flu shot in late summer, fall, or winter. Ask your doctor when you should get your flu shot. Avoid contact with people who are sick during fall and winter. This is cold and flu season. Contact a doctor if: You get new symptoms. You have: Chest pain. Watery poop (diarrhea). A fever. Your cough gets worse. You start to have more mucus. You feel sick to your stomach. You throw up. Get help right away if you: Have shortness of breath. Have trouble breathing. Have skin or nails that turn a bluish color. Have very bad pain or stiffness in your neck. Get a sudden headache. Get sudden pain in  your face or ear. Cannot eat or drink without throwing up. These symptoms may represent a serious problem that is an emergency. Get medical help right away. Call your local emergency services (911 in the [place] Do not wait to see if the symptoms will go away. Do not drive yourself to the hospital. Summary Influenza is also called "the flu." It is an infection in the lungs, nose, and throat. It spreads easily from person to person. Take over-the-counter and prescription medicines only as told by your doctor. Getting a flu shot every year is the best way to not get the flu. This information is not intended to replace advice given to you by your health care provider. Make sure you discuss any questions you have with your health care provider. Document Revised: 10/23/2019 Document Reviewed: 10/23/2019 Elsevier Patient Education   2023 Elsevier Inc.      If you have been instructed to have an in-person evaluation today at a local Urgent Care facility, please use the link below. It will take you to a list of all of our available [facility], including address, phone number and hours of operation. Please do not delay care.  [facility]  If you or a family member do not have a primary care provider, use the link below to schedule a visit and establish care. When you choose a [facility] primary care physician or advanced practice provider, you gain a long-term partner in health. Find a Primary Care Provider  Learn more about [facility]'s in-office and virtual care options: [facility] - Get Care Now

## 2023-05-03 ENCOUNTER — Encounter: Payer: Self-pay | Admitting: Family Medicine

## 2023-05-04 ENCOUNTER — Ambulatory Visit: Payer: 59

## 2023-05-07 ENCOUNTER — Encounter: Payer: Self-pay | Admitting: Nurse Practitioner

## 2023-05-07 ENCOUNTER — Telehealth: Payer: 59 | Admitting: Nurse Practitioner

## 2023-05-07 ENCOUNTER — Ambulatory Visit: Payer: 59 | Admitting: Nurse Practitioner

## 2023-05-07 VITALS — BP 130/85 | HR 114 | Temp 102.0°F | Ht 65.0 in | Wt 203.4 lb

## 2023-05-07 DIAGNOSIS — R058 Other specified cough: Secondary | ICD-10-CM

## 2023-05-07 DIAGNOSIS — J069 Acute upper respiratory infection, unspecified: Secondary | ICD-10-CM

## 2023-05-07 DIAGNOSIS — B9689 Other specified bacterial agents as the cause of diseases classified elsewhere: Secondary | ICD-10-CM | POA: Diagnosis not present

## 2023-05-07 MED ORDER — AMOXICILLIN-POT CLAVULANATE 875-125 MG PO TABS: 1.0000 | ORAL_TABLET | Freq: Two times a day (BID) | ORAL | 0 refills | Status: DC

## 2023-05-07 MED ORDER — PREDNISONE 20 MG PO TABS: ORAL_TABLET | ORAL | 0 refills | Status: DC

## 2023-05-07 MED ORDER — PROMETHAZINE-DM 6.25-15 MG/5ML PO SYRP: 5.0000 mL | ORAL_SOLUTION | Freq: Four times a day (QID) | ORAL | 0 refills | Status: DC | PRN

## 2023-05-07 MED ORDER — ALBUTEROL SULFATE HFA 108 (90 BASE) MCG/ACT IN AERS: 2.0000 | INHALATION_SPRAY | RESPIRATORY_TRACT | 0 refills | Status: DC | PRN

## 2023-05-07 NOTE — Patient Instructions (Signed)
Align probiotics.

## 2023-05-07 NOTE — Progress Notes (Unsigned)
 Subjective:    Patient ID: Suzanne Hart, female    DOB: Nov 05, 1965, 58 y.o.   MRN: [id number]  HPI Kynesha presents today with chills, fatigue, fever, body aches, cough and headache. Was seen in Urgent care for a video visit on 04/30/2023. Got prescribed tamiflu and tessalon pearls. Took 4-5 tablets of Tamiflu, and reports that it caused diarrhea so she stopped taking the medication. Improved initially, and then started to worsen last Friday. Has had fever of 101.5 at home. Complains of decreased appetite. Has been making herself eat and stay hydrated. Reports sinus congestion and headache. Headache worsens with coughing. Cough is spasmodic, and productive with white or green mucus. Reports pressure in ears. She smokes 1/2 pack of cigarettes per day, but has stopped since being sick. Has been taking Advil and using Albuterol inhaler which has improved symptoms. Reports that Tylenol upset her stomach.    Review of Systems  Constitutional:  Positive for appetite change, chills, fatigue and fever.  HENT:  Positive for congestion and sinus pressure. Negative for ear discharge, ear pain, sinus pain and sore throat.   Respiratory:  Positive for cough. Negative for chest tightness, shortness of breath and wheezing.   Cardiovascular:  Negative for chest pain and palpitations.  Gastrointestinal:  Positive for diarrhea and nausea. Negative for constipation and vomiting.  Genitourinary:  Negative for difficulty urinating.  Neurological:  Positive for headaches.      Objective:   Physical Exam Vitals and nursing note reviewed.  Constitutional:      General: She is not in acute distress.    Appearance: She is ill-appearing.  HENT:     Right Ear: Tympanic membrane is retracted.     Left Ear: Tympanic membrane is retracted.     Mouth/Throat:     Mouth: Mucous membranes are moist.     Pharynx: No oropharyngeal exudate.     Comments: Mild erythema in pharynx.  Cardiovascular:     Rate and Rhythm:  Regular rhythm. Tachycardia present.     Heart sounds: S1 normal and S2 normal. No murmur heard. Pulmonary:     Breath sounds: Normal breath sounds.  Abdominal:     General: There is no distension.     Palpations: Abdomen is soft.     Tenderness: There is no abdominal tenderness.     Comments: No obvious masses or abnormalities noted.   Lymphadenopathy:     Cervical: Cervical adenopathy present.  Skin:    General: Skin is warm and dry.  Neurological:     Mental Status: She is alert.  Psychiatric:        Mood and Affect: Mood normal.        Behavior: Behavior normal.        Thought Content: Thought content normal.        Judgment: Judgment normal.    Vitals:   05/07/23 1308  BP: 130/85  Pulse: (!) 114  Temp: (!) 102 F (38.9 C)  Height: 5\' 5"  (1.651 m)  Weight: 203 lb 6.4 oz (92.3 kg)  SpO2: 97%  BMI (Calculated): 33.85       Assessment & Plan:   1. Bacterial URI (Primary) Meds ordered this encounter  Medications   amoxicillin-clavulanate (AUGMENTIN) 875-125 MG tablet    Sig: Take 1 tablet by mouth 2 (two) times daily.    Dispense:  14 tablet    Refill:  0    Supervising Provider:   Lilyan Punt A [[id number]]   predniSONE (DELTASONE)  20 MG tablet    Sig: Take two po every day x 5 days    Dispense:  10 tablet    Refill:  0    Supervising Provider:   Lilyan Punt A [[id number]]   albuterol (VENTOLIN HFA) 108 (90 Base) MCG/ACT inhaler    Sig: Inhale 2 puffs into the lungs every 4 (four) hours as needed.    Dispense:  18 g    Refill:  0    Supervising Provider:   Lilyan Punt A [[id number]]   promethazine-dextromethorphan (PROMETHAZINE-DM) 6.25-15 MG/5ML syrup    Sig: Take 5 mLs by mouth 4 (four) times daily as needed for cough.    Dispense:  118 mL    Refill:  0    Supervising Provider:   Babs Sciara [phone]    -Educated patient to take daily probiotic while taking Augmentin.  -Advised patient to take Tylenol as needed.  2. Spasmodic cough -Advised patient to not  drive while taking Promethazine DM.   Advised patient that if she starts having vomiting, trouble breathing, or chest pain to go to the emergency department. If not any better by Monday, to let the office know.   Return if symptoms worsen or fail to improve.   I have seen and examined this patient alongside the NP student. I have reviewed and verified the student note and agree with the assessment and plan.  Sherie Don, FNP

## 2023-05-11 ENCOUNTER — Encounter: Payer: Self-pay | Admitting: Nurse Practitioner

## 2023-05-14 ENCOUNTER — Encounter: Payer: Self-pay | Admitting: Nurse Practitioner

## 2023-05-19 ENCOUNTER — Encounter: Payer: Self-pay | Admitting: Family Medicine

## 2023-05-20 ENCOUNTER — Encounter: Payer: Self-pay | Admitting: Physician Assistant

## 2023-05-20 ENCOUNTER — Other Ambulatory Visit: Payer: Self-pay | Admitting: Physician Assistant

## 2023-05-20 ENCOUNTER — Ambulatory Visit: Admitting: Physician Assistant

## 2023-05-20 VITALS — BP 120/76 | HR 97 | Temp 98.4°F | Ht 65.0 in | Wt 202.0 lb

## 2023-05-20 DIAGNOSIS — J069 Acute upper respiratory infection, unspecified: Secondary | ICD-10-CM

## 2023-05-20 MED ORDER — PREDNISONE 20 MG PO TABS
40.0000 mg | ORAL_TABLET | Freq: Every day | ORAL | 0 refills | Status: AC
Start: 2023-05-20 — End: 2023-05-25

## 2023-05-20 NOTE — Progress Notes (Signed)
 Acute Office Visit  Subjective:     Patient ID: Suzanne Hart, female    DOB: August 28, 1965, 58 y.o.   MRN: [id number]   HPI Patient is in today for cough and congestion. She reports symptoms began on Friday with fever and fatigue. Associated symptoms include headache, chest tightness, sinus pain, and loss of taste.  Patient states she was sick in the middle of February treated with Augmentin and prednisone, symptoms improved at that time.  She reports taking over-the-counter Tylenol and saline nasal spray as needed for symptoms.  She reports sick contacts at work.  She denies chest pain or difficulty breathing.  Review of Systems  Constitutional:  Positive for fever and malaise/fatigue.  HENT:  Positive for congestion and sinus pain. Negative for ear pain and sore throat.   Respiratory:  Positive for cough. Negative for shortness of breath and wheezing.   Cardiovascular:  Negative for chest pain and palpitations.  Gastrointestinal:  Negative for abdominal pain and nausea.  Neurological:  Positive for headaches.        Objective:     BP 120/76   Pulse 97   Temp 98.4 F (36.9 C)   Ht 5\' 5"  (1.651 m)   Wt 202 lb (91.6 kg)   SpO2 100%   BMI 33.61 kg/m   Physical Exam Vitals reviewed.  Constitutional:      General: She is not in acute distress.    Appearance: Normal appearance.  HENT:     Right Ear: Tympanic membrane normal.     Left Ear: Tympanic membrane normal.     Nose: Congestion and rhinorrhea present.     Mouth/Throat:     Mouth: Mucous membranes are moist.     Pharynx: Oropharynx is clear.  Eyes:     Extraocular Movements: Extraocular movements intact.     Conjunctiva/sclera: Conjunctivae normal.  Cardiovascular:     Rate and Rhythm: Normal rate and regular rhythm.     Heart sounds: No murmur heard. Pulmonary:     Effort: Pulmonary effort is normal.     Breath sounds: Normal breath sounds. No wheezing.  Musculoskeletal:        General: Normal range of  motion.  Skin:    General: Skin is warm and dry.     Capillary Refill: Capillary refill takes less than 2 seconds.  Neurological:     General: No focal deficit present.     Mental Status: She is alert and oriented to person, place, and time.  Psychiatric:        Mood and Affect: Mood normal.        Behavior: Behavior normal.     No results found for any visits on 05/20/23.      Assessment & Plan:  URI, acute -     COVID-19, Flu A+B and RSV -     predniSONE; Take 2 tablets (40 mg total) by mouth daily for 5 days.  Dispense: 10 tablet; Refill: 0   Patient appears stable today. Benign exam. Likely self-resolving viral infection, swabbed for flu/covid/rsv today. If viral swab comes back negative will treat as sinusitis. Supportive care reviewed with patient. Discussed with patient that there are no indications for antibiotics at this time, and viral respiratory illness can be persistent in duration.Prednisone burst for chest tightness. Patient to continue albuterol inhalers and promethazine DM cough syrup at home. Tylenol or ibuprofen for pain or fever as needed. I advised Flonase for nasal congestion and ear pressure, and Zyrtec  for congestion/post nasal drip. Patient instructed to return to clinic if worsening shortness of breath, chest pain, hypoxia, or other concerns. Patient agreeable to plan.    Return if symptoms worsen or fail to improve.  Toni Amend Wiley Magan, PA-C

## 2023-05-22 ENCOUNTER — Encounter: Payer: Self-pay | Admitting: Physician Assistant

## 2023-05-22 ENCOUNTER — Other Ambulatory Visit: Payer: Self-pay | Admitting: Physician Assistant

## 2023-05-22 DIAGNOSIS — J019 Acute sinusitis, unspecified: Secondary | ICD-10-CM

## 2023-05-22 LAB — COVID-19, FLU A+B AND RSV
Influenza A, NAA: NOT DETECTED
Influenza B, NAA: NOT DETECTED
RSV, NAA: NOT DETECTED
SARS-CoV-2, NAA: NOT DETECTED

## 2023-05-22 LAB — SPECIMEN STATUS REPORT

## 2023-05-22 MED ORDER — DOXYCYCLINE HYCLATE 100 MG PO TABS: 100.0000 mg | ORAL_TABLET | Freq: Two times a day (BID) | ORAL | 0 refills | Status: AC

## 2023-06-03 ENCOUNTER — Telehealth: Admitting: Nurse Practitioner

## 2023-06-03 ENCOUNTER — Encounter: Payer: Self-pay | Admitting: Nurse Practitioner

## 2023-06-03 DIAGNOSIS — F4310 Post-traumatic stress disorder, unspecified: Secondary | ICD-10-CM

## 2023-06-03 DIAGNOSIS — F419 Anxiety disorder, unspecified: Secondary | ICD-10-CM | POA: Diagnosis not present

## 2023-06-03 DIAGNOSIS — F32A Depression, unspecified: Secondary | ICD-10-CM

## 2023-06-03 MED ORDER — SERTRALINE HCL 50 MG PO TABS: ORAL_TABLET | ORAL | 0 refills | Status: DC

## 2023-06-03 NOTE — Progress Notes (Signed)
 Virtual Visit via Video Note  I connected with Suzanne Hart on 06/03/23 at 10:20 AM EDT by a video enabled telemedicine application and verified that I am speaking with the correct person using two identifiers.  Location: Patient: car Provider: office    I discussed the limitations of evaluation and management by telemedicine and the availability of in person appointments. The patient expressed understanding and agreed to proceed.  History of Present Illness: Presents for complaints of an increase in her depression and anxiety.  Has been dealing with some major issues regarding her granddaughter which has caused a lot of stress and also some feelings of trauma related to history with her daughter.  Can fall asleep without difficulty but waking up every 2 hours worrying.  Denies suicidal or homicidal thoughts or ideation.  Believes she has taken sertraline in the past which worked well but caused trouble with sleeping, was taking it at nighttime.  Observations/Objective: NAD.  Alert, oriented.  Slightly tearful at times.  Making good eye contact.  Speech clear.  Normal judgment and behavior.  Assessment and Plan: Problem List Items Addressed This Visit       Other   Anxiety and depression - Primary   Relevant Medications   sertraline (ZOLOFT) 50 MG tablet   Post traumatic stress disorder (PTSD)   Relevant Medications   sertraline (ZOLOFT) 50 MG tablet   Meds ordered this encounter  Medications   sertraline (ZOLOFT) 50 MG tablet    Sig: Take 1/2 tab po each morning x 6 days then one tab po each morning    Dispense:  30 tablet    Refill:  0    Supervising Provider:   Lilyan Punt A [[id number]]   Restart sertraline as directed.  Recommend she take this in the morning. Discontinue if any side effects including restless legs and contact office. Discussed the importance of a healthy lifestyle including diet, activity sleep and stress reduction.  Strongly recommend that patient start  counseling especially for past trauma.  She agrees with this plan. Return in about 1 month (around 07/04/2023) for video or in person. Call back sooner if needed.  Follow Up Instructions:    I discussed the assessment and treatment plan with the patient. The patient was provided an opportunity to ask questions and all were answered. The patient agreed with the plan and demonstrated an understanding of the instructions.   The patient was advised to call back or seek an in-person evaluation if the symptoms worsen or if the condition fails to improve as anticipated.  I provided 15 minutes of non-face-to-face time during this encounter.   Campbell Riches, NP

## 2023-06-09 ENCOUNTER — Telehealth: Admitting: Physician Assistant

## 2023-06-09 DIAGNOSIS — R6889 Other general symptoms and signs: Secondary | ICD-10-CM

## 2023-06-09 MED ORDER — OSELTAMIVIR PHOSPHATE 75 MG PO CAPS: 75.0000 mg | ORAL_CAPSULE | Freq: Two times a day (BID) | ORAL | 0 refills | Status: AC

## 2023-06-09 MED ORDER — BENZONATATE 100 MG PO CAPS: 100.0000 mg | ORAL_CAPSULE | Freq: Three times a day (TID) | ORAL | 0 refills | Status: DC | PRN

## 2023-06-09 MED ORDER — ONDANSETRON HCL 4 MG PO TABS: 4.0000 mg | ORAL_TABLET | Freq: Three times a day (TID) | ORAL | 0 refills | Status: AC | PRN

## 2023-06-09 NOTE — Progress Notes (Signed)
 Virtual Visit Consent   Suzanne Hart, you are scheduled for a virtual visit with a [facility] provider today. Just as with appointments in the office, your consent must be obtained to participate. Your consent will be active for this visit and any virtual visit you may have with one of our providers in the next 365 days. If you have a MyChart account, a copy of this consent can be sent to you electronically.  As this is a virtual visit, video technology does not allow for your provider to perform a traditional examination. This may limit your provider's ability to fully assess your condition. If your provider identifies any concerns that need to be evaluated in person or the need to arrange testing (such as labs, EKG, etc.), we will make arrangements to do so. Although advances in technology are sophisticated, we cannot ensure that it will always work on either your end or our end. If the connection with a video visit is poor, the visit may have to be switched to a telephone visit. With either a video or telephone visit, we are not always able to ensure that we have a secure connection.  By engaging in this virtual visit, you consent to the provision of healthcare and authorize for your insurance to be billed (if applicable) for the services provided during this visit. Depending on your insurance coverage, you may receive a charge related to this service.  I need to obtain your verbal consent now. Are you willing to proceed with your visit today? Suzanne Hart has provided verbal consent on 06/09/2023 for a virtual visit (video or telephone). [name], PA-C  Date: 06/09/2023 2:26 PM   Virtual Visit via Video Note   I, [name], connected with  Suzanne Hart  ([id number], 1965-10-20) on 06/09/23 at  2:15 PM EDT by a video-enabled telemedicine application and verified that I am speaking with the correct person using two identifiers.  Location: Patient: Virtual Visit Location Patient:  Home Provider: Virtual Visit Location Provider: Home Office   I discussed the limitations of evaluation and management by telemedicine and the availability of in person appointments. The patient expressed understanding and agreed to proceed.    History of Present Illness: Suzanne Hart is a 58 y.o. who identifies as a female who was assigned female at birth, and is being seen today for cough, congestion, fever that started last night.  Reports husband is sick with similar sx.  She reports nausea as well. Has been taking no medication due to nausea. Husband took a home COVID test which was negative.   HPI: HPI  Problems:  Patient Active Problem List   Diagnosis Date Noted   Post traumatic stress disorder (PTSD) 06/03/2023   Muscle spasms of neck 03/05/2023   Muscle strain of left upper arm 03/05/2023   Anxiety and depression 11/23/2022   Gastroesophageal reflux disease without esophagitis 11/22/2022   Irritable bowel syndrome with both constipation and diarrhea 11/22/2022   Prediabetes 09/13/2021   Adrenal adenoma, left 08/16/2021   Elevated blood pressure reading 08/16/2021   Class 1 obesity due to excess calories with serious comorbidity and body mass index (BMI) of 33.0 to 33.9 in adult 08/16/2021   Current smoker 08/16/2021   Mixed hyperlipidemia 11/05/2020   Diarrhea of presumed infectious origin 07/18/2020   Anxiety, generalized 05/22/2016   Dyspepsia 12/18/2012   Rectal bleeding 11/24/2012   Menopause 11/24/2012    Allergies:  Allergies  Allergen Reactions  Zoloft [Sertraline]     Restless legs could not sleep   Medications:  Current Outpatient Medications:    benzonatate (TESSALON) 100 MG capsule, Take 1 capsule (100 mg total) by mouth 3 (three) times daily as needed., Disp: 20 capsule, Rfl: 0   ondansetron (ZOFRAN) 4 MG tablet, Take 1 tablet (4 mg total) by mouth every 8 (eight) hours as needed for nausea or vomiting., Disp: 20 tablet, Rfl: 0   oseltamivir (TAMIFLU) 75  MG capsule, Take 1 capsule (75 mg total) by mouth 2 (two) times daily for 5 days., Disp: 10 capsule, Rfl: 0   albuterol (VENTOLIN HFA) 108 (90 Base) MCG/ACT inhaler, Inhale 2 puffs into the lungs every 4 (four) hours as needed., Disp: 18 g, Rfl: 0   pantoprazole (PROTONIX) 40 MG tablet, Take 1 tablet (40 mg total) by mouth 2 (two) times daily before a meal., Disp: 60 tablet, Rfl: 0   promethazine-dextromethorphan (PROMETHAZINE-DM) 6.25-15 MG/5ML syrup, Take 5 mLs by mouth 4 (four) times daily as needed for cough., Disp: 118 mL, Rfl: 0   sertraline (ZOLOFT) 50 MG tablet, Take 1/2 tab po each morning x 6 days then one tab po each morning, Disp: 30 tablet, Rfl: 0  Observations/Objective: Patient is well-developed, well-nourished in no acute distress.  Resting comfortably  at home.  Head is normocephalic, atraumatic.  No labored breathing.  Speech is clear and coherent with logical content.  Patient is alert and oriented at baseline.    Assessment and Plan: 1. Flu-like symptoms (Primary)  Tamiflu prescribed as well as tessalon and zofran.  Pt appears stable, in no acute distress.  In person evaluation precautions discussed.   Follow Up Instructions: I discussed the assessment and treatment plan with the patient. The patient was provided an opportunity to ask questions and all were answered. The patient agreed with the plan and demonstrated an understanding of the instructions.  A copy of instructions were sent to the patient via MyChart unless otherwise noted below.     The patient was advised to call back or seek an in-person evaluation if the symptoms worsen or if the condition fails to improve as anticipated.    [name], PA-C

## 2023-06-09 NOTE — Patient Instructions (Signed)
 Suzanne Hart, thank you for joining Suzanne Fantasia Ward, PA-C for today's virtual visit.  While this provider is not your primary care provider (PCP), if your PCP is located in our provider database this encounter information will be shared with them immediately following your visit.   A [facility] MyChart account gives you access to today's visit and all your visits, tests, and labs performed at [facility] " click here if you don't have a [facility] MyChart account or go to [email or web address]  Consent: (Patient) Suzanne Hart provided verbal consent for this virtual visit at the beginning of the encounter.  Current Medications:  Current Outpatient Medications:    benzonatate (TESSALON) 100 MG capsule, Take 1 capsule (100 mg total) by mouth 3 (three) times daily as needed., Disp: 20 capsule, Rfl: 0   ondansetron (ZOFRAN) 4 MG tablet, Take 1 tablet (4 mg total) by mouth every 8 (eight) hours as needed for nausea or vomiting., Disp: 20 tablet, Rfl: 0   oseltamivir (TAMIFLU) 75 MG capsule, Take 1 capsule (75 mg total) by mouth 2 (two) times daily for 5 days., Disp: 10 capsule, Rfl: 0   albuterol (VENTOLIN HFA) 108 (90 Base) MCG/ACT inhaler, Inhale 2 puffs into the lungs every 4 (four) hours as needed., Disp: 18 g, Rfl: 0   pantoprazole (PROTONIX) 40 MG tablet, Take 1 tablet (40 mg total) by mouth 2 (two) times daily before a meal., Disp: 60 tablet, Rfl: 0   promethazine-dextromethorphan (PROMETHAZINE-DM) 6.25-15 MG/5ML syrup, Take 5 mLs by mouth 4 (four) times daily as needed for cough., Disp: 118 mL, Rfl: 0   sertraline (ZOLOFT) 50 MG tablet, Take 1/2 tab po each morning x 6 days then one tab po each morning, Disp: 30 tablet, Rfl: 0   Medications ordered in this encounter:  Meds ordered this encounter  Medications   oseltamivir (TAMIFLU) 75 MG capsule    Sig: Take 1 capsule (75 mg total) by mouth 2 (two) times daily for 5 days.    Dispense:  10 capsule    Refill:  0     Supervising Provider:   Merrilee Jansky [[id number]]   benzonatate (TESSALON) 100 MG capsule    Sig: Take 1 capsule (100 mg total) by mouth 3 (three) times daily as needed.    Dispense:  20 capsule    Refill:  0    Supervising Provider:   LAMPTEY, PHILIP O [[id number]]   ondansetron (ZOFRAN) 4 MG tablet    Sig: Take 1 tablet (4 mg total) by mouth every 8 (eight) hours as needed for nausea or vomiting.    Dispense:  20 tablet    Refill:  0    Supervising Provider:   Merrilee Jansky [[id number]]     *If you need refills on other medications prior to your next appointment, please contact your pharmacy*  Follow-Up: Call back or seek an in-person evaluation if the symptoms worsen or if the condition fails to improve as anticipated.  [facility] [phone]  Other Instructions Take Tamiflu as prescribed.  Can take Zofran as needed for nausea.  Can take tessalon as needed for cough.  Recommend Ibuprofen or Tylenol as needed for fever, body aches, headache.  Can take Mucinex and Flonase for congestion.    If you have been instructed to have an in-person evaluation today at a local Urgent Care facility, please use the link below. It will take you to a list of all of our available [facility], including address, phone number and hours of operation. Please do not delay care.  [facility]  If you or a family member do not have a primary care provider, use the link below to schedule a visit and establish care. When you choose a [facility] primary care physician or advanced practice provider, you gain a long-term partner in health. Find a Primary Care Provider  Learn more about [facility] in-office and virtual care options: [facility] - Get Care Now

## 2023-07-23 ENCOUNTER — Encounter: Payer: Self-pay | Admitting: Nurse Practitioner

## 2023-07-23 ENCOUNTER — Other Ambulatory Visit: Payer: Self-pay | Admitting: Family Medicine

## 2023-07-26 ENCOUNTER — Other Ambulatory Visit: Payer: Self-pay | Admitting: Nurse Practitioner

## 2023-07-26 MED ORDER — ESCITALOPRAM OXALATE 10 MG PO TABS: 10.0000 mg | ORAL_TABLET | Freq: Every day | ORAL | 0 refills | Status: DC

## 2023-12-31 ENCOUNTER — Encounter: Payer: Self-pay | Admitting: Nurse Practitioner

## 2024-01-10 ENCOUNTER — Ambulatory Visit: Payer: Self-pay | Admitting: Nurse Practitioner

## 2024-01-10 ENCOUNTER — Encounter: Payer: Self-pay | Admitting: Nurse Practitioner

## 2024-01-10 VITALS — BP 131/86 | HR 69 | Temp 98.1°F | Ht 65.0 in | Wt 208.0 lb

## 2024-01-10 DIAGNOSIS — K219 Gastro-esophageal reflux disease without esophagitis: Secondary | ICD-10-CM

## 2024-01-10 DIAGNOSIS — Z23 Encounter for immunization: Secondary | ICD-10-CM

## 2024-01-10 DIAGNOSIS — F17200 Nicotine dependence, unspecified, uncomplicated: Secondary | ICD-10-CM

## 2024-01-10 DIAGNOSIS — E782 Mixed hyperlipidemia: Secondary | ICD-10-CM | POA: Diagnosis not present

## 2024-01-10 DIAGNOSIS — F32A Depression, unspecified: Secondary | ICD-10-CM

## 2024-01-10 DIAGNOSIS — R7303 Prediabetes: Secondary | ICD-10-CM

## 2024-01-10 DIAGNOSIS — R5383 Other fatigue: Secondary | ICD-10-CM

## 2024-01-10 DIAGNOSIS — F419 Anxiety disorder, unspecified: Secondary | ICD-10-CM

## 2024-01-10 MED ORDER — DULOXETINE HCL 20 MG PO CPEP: 20.0000 mg | ORAL_CAPSULE | Freq: Every day | ORAL | 0 refills | Status: DC

## 2024-01-10 NOTE — Progress Notes (Signed)
   Subjective:    Patient ID: Suzanne Hart, female    DOB: Aug 11, 1965, 59 y.o.   MRN: [id number]  HPI  Pt states she believes she had a panic attack 1 week ago Woke up out of her sleep with a racing heart a sob  Review of Systems     Objective:   Physical Exam        Assessment & Plan:

## 2024-01-10 NOTE — Patient Instructions (Signed)
 [facility] - [email or web address]

## 2024-01-10 NOTE — Progress Notes (Signed)
 Subjective:    Patient ID: Suzanne Hart, female    DOB: [birth year], 58 y.o.   MRN: [id number] CC: Pt arrived with reports of a panic attack  HPI 58 year old female, arrived with complaints of a panic attack in the middle of the night. She states that she was sleeping when she woke up with her heart racing and short of breath. She got up and took a baby aspirin, took some deep breathes and laid back down. The episode lasted for about 10-20 minutes. In addition, she feels that she has trouble concentrating and is sometimes forgetful. 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 In addition, her spouse has began showing signs of dementia. She sleeps well and wakes up throughout the night to void. In addition, she has been eating a lot of fast food lately and partakes in coffee in the morning, but none after lunch. She is working on trying to incorporate physical activity in her routine. She mentioned that she enjoys painting and was able to participate in painting, earlier this week. Also, patient reports that she recently began taking Magnesium  300mg  daily and it has been helping with sleep, mood and pain. Discontinued Lexapro  due to persistent diarrhea after a few weeks.    Review of Systems  Constitutional:  Positive for fatigue. Negative for activity change, appetite change and fever.  HENT:  Negative for sore throat and trouble swallowing.   Respiratory:  Negative for cough, shortness of breath and wheezing.   Cardiovascular:  Negative for chest pain.  Gastrointestinal:  Negative for abdominal pain, constipation, diarrhea, nausea and vomiting.  Neurological:  Negative for headaches.  Psychiatric/Behavioral:  Negative for sleep disturbance. The patient is nervous/anxious.        01/10/2024    8:22 AM 05/20/2023    2:36 PM 05/07/2023    1:17 PM 03/05/2023   10:24 AM  GAD 7 : Generalized Anxiety Score  Nervous, Anxious, on Edge 1 1 0 0  Control/stop  worrying 1 1 0 0  Worry too much - different things 1 1 0 0  Trouble relaxing 1 1 1 1   Restless 0 1 0 1  Easily annoyed or irritable 1 1 0 0  Afraid - awful might happen 1 1 0 0  Total GAD 7 Score 6 7 1 2   Anxiety Difficulty Somewhat difficult Somewhat difficult  Somewhat difficult        01/10/2024    8:21 AM 05/20/2023    2:36 PM 05/07/2023    1:16 PM  Depression screen PHQ 2/9  Decreased Interest 1 1 0  Down, Depressed, Hopeless 0 1 0  PHQ - 2 Score 1 2 0  Altered sleeping 1 1 1   Tired, decreased energy 1 1 1   Change in appetite 1 1 1   Feeling bad or failure about yourself  0 0 0  Trouble concentrating 3 1 0  Moving slowly or fidgety/restless 0 0 0  Suicidal thoughts 0 0 0  PHQ-9 Score 7 6 3   Difficult doing work/chores Somewhat difficult Somewhat difficult        Objective:   Physical Exam Vitals and nursing note reviewed.  Constitutional:      General: She is not in acute distress.    Appearance: Normal appearance.  Neck:     Comments: Thyroid palpable - soft, nontender; mass or goiter noted.  Cardiovascular:     Rate and Rhythm: Normal rate and regular rhythm.  Heart sounds: Normal heart sounds.  Pulmonary:     Effort: Pulmonary effort is normal.     Breath sounds: Normal breath sounds.  Abdominal:     General: Abdomen is flat.     Palpations: Abdomen is soft.     Tenderness: There is no abdominal tenderness. There is no guarding.  Musculoskeletal:     Cervical back: No tenderness.  Skin:    General: Skin is warm and dry.  Neurological:     Mental Status: She is alert and oriented to person, place, and time.  Psychiatric:        Mood and Affect: Mood normal.        Behavior: Behavior normal.        Thought Content: Thought content normal.     Vitals:   01/10/24 0817 01/10/24 0917  BP: (!) 140/88 131/86  Pulse: 69   Temp: 98.1 F (36.7 C)   Height: 5' 5 (1.651 m)   Weight: 94.3 kg   SpO2: 99%   BMI (Calculated): 34.61            Assessment & Plan:   1. Anxiety and depression (Primary) - DULoxetine (CYMBALTA) 20 MG capsule; Take 1 capsule (20 mg total) by mouth daily.  Dispense: 30 capsule; Refill: 0 Patient instructed to follow up in 2 weeks with a phone call or message about how the medication is working for her and if there are any side effects. Plan to slowly titrate if needed and tolerated.  DC medication and contact office if any adverse effects.  She was encouraged to find time to participate in hobbies she enjoys such as painting. She was encouraged to participate in counseling and given resources on how to find one.   2. Gastroesophageal reflux disease without esophagitis Patient encouraged to continue taking prescribed medication as directed.  Continue to sit up for at least half an hour after meals and to eat at least 1 hour before bed time.   3. Current smoker Patient reports that her smoking has recently increased and is smoking 1/2 pack of cigarettes daily. Patient was educated on trying to cut back slowly on tobacco use.   4. Mixed hyperlipidemia - Lipid Profile  5. Prediabetes Patient encouraged to create healthy meals and to participate in physical activity at least 3 times a week for 30 minutes.  Encouraged weight loss.  - Comprehensive metabolic panel with GFR - Hemoglobin A1c  6. Fatigue, unspecified type - CBC with Differential - TSH - Comprehensive metabolic panel with GFR  7. Immunization due - Flu vaccine trivalent PF, 6mos and older(Flulaval,Afluria,Fluarix,Fluzone)   Return in about 3 months (around 04/11/2024).    I have seen and examined this patient alongside the NP student. I have reviewed and verified the student note and agree with the assessment and plan.  Elveria Quarry, FNP

## 2024-01-10 NOTE — Progress Notes (Deleted)
 l

## 2024-01-11 LAB — CBC WITH DIFFERENTIAL/PLATELET
Basophils Absolute: 0.1 x10E3/uL (ref 0.0–0.2)
Basos: 1 %
EOS (ABSOLUTE): 0.3 x10E3/uL (ref 0.0–0.4)
Eos: 3 %
Hematocrit: 46.5 % (ref 34.0–46.6)
Hemoglobin: 14.8 g/dL (ref 11.1–15.9)
Immature Grans (Abs): 0 x10E3/uL (ref 0.0–0.1)
Immature Granulocytes: 0 %
Lymphocytes Absolute: 2.9 x10E3/uL (ref 0.7–3.1)
Lymphs: 34 %
MCH: 29.7 pg (ref 26.6–33.0)
MCHC: 31.8 g/dL (ref 31.5–35.7)
MCV: 93 fL (ref 79–97)
Monocytes Absolute: 0.5 x10E3/uL (ref 0.1–0.9)
Monocytes: 6 %
Neutrophils Absolute: 4.8 x10E3/uL (ref 1.4–7.0)
Neutrophils: 56 %
Platelets: 321 x10E3/uL (ref 150–450)
RBC: 4.99 x10E6/uL (ref 3.77–5.28)
RDW: 13.9 % (ref 11.7–15.4)
WBC: 8.6 x10E3/uL (ref 3.4–10.8)

## 2024-01-11 LAB — COMPREHENSIVE METABOLIC PANEL WITH GFR
ALT: 18 IU/L (ref 0–32)
AST: 16 IU/L (ref 0–40)
Albumin: 4.4 g/dL (ref 3.8–4.9)
Alkaline Phosphatase: 134 IU/L (ref 49–135)
BUN/Creatinine Ratio: 15 (ref 9–23)
BUN: 10 mg/dL (ref 6–24)
Bilirubin Total: 0.2 mg/dL (ref 0.0–1.2)
CO2: 25 mmol/L (ref 20–29)
Calcium: 9.5 mg/dL (ref 8.7–10.2)
Chloride: 99 mmol/L (ref 96–106)
Creatinine, Ser: 0.66 mg/dL (ref 0.57–1.00)
Globulin, Total: 2.9 g/dL (ref 1.5–4.5)
Glucose: 86 mg/dL (ref 70–99)
Potassium: 4.6 mmol/L (ref 3.5–5.2)
Sodium: 138 mmol/L (ref 134–144)
Total Protein: 7.3 g/dL (ref 6.0–8.5)
eGFR: 102 mL/min/1.73 (ref >59)

## 2024-01-11 LAB — LIPID PANEL
Chol/HDL Ratio: 5.7 ratio — ABNORMAL HIGH (ref 0.0–4.4)
Cholesterol, Total: 250 mg/dL — ABNORMAL HIGH (ref 100–199)
HDL: 44 mg/dL (ref >39)
LDL Chol Calc (NIH): 183 mg/dL — ABNORMAL HIGH (ref 0–99)
Triglycerides: 127 mg/dL (ref 0–149)
VLDL Cholesterol Cal: 23 mg/dL (ref 5–40)

## 2024-01-11 LAB — HEMOGLOBIN A1C
Est. average glucose Bld gHb Est-mCnc: 128 mg/dL
Hgb A1c MFr Bld: 6.1 % — ABNORMAL HIGH (ref 4.8–5.6)

## 2024-01-11 LAB — TSH: TSH: 2.87 u[IU]/mL (ref 0.450–4.500)

## 2024-01-12 ENCOUNTER — Ambulatory Visit: Payer: Self-pay | Admitting: Nurse Practitioner

## 2024-03-07 ENCOUNTER — Other Ambulatory Visit: Payer: Self-pay | Admitting: Nurse Practitioner

## 2024-03-07 DIAGNOSIS — F419 Anxiety disorder, unspecified: Secondary | ICD-10-CM

## 2024-03-31 ENCOUNTER — Other Ambulatory Visit: Payer: Self-pay | Admitting: Medical Genetics

## 2024-04-01 ENCOUNTER — Other Ambulatory Visit (HOSPITAL_COMMUNITY)
Admission: RE | Admit: 2024-04-01 | Discharge: 2024-04-01 | Disposition: A | Discharge status: Home / Self Care | Payer: Self-pay | Source: Ambulatory Visit | Attending: Medical Genetics | Admitting: Medical Genetics

## 2024-04-03 ENCOUNTER — Ambulatory Visit: Admitting: Nurse Practitioner

## 2024-04-03 VITALS — BP 128/86 | HR 65 | Temp 98.4°F | Ht 65.0 in | Wt 207.1 lb

## 2024-04-03 DIAGNOSIS — F419 Anxiety disorder, unspecified: Secondary | ICD-10-CM | POA: Diagnosis not present

## 2024-04-03 DIAGNOSIS — F32A Depression, unspecified: Secondary | ICD-10-CM | POA: Diagnosis not present

## 2024-04-03 DIAGNOSIS — M62838 Other muscle spasm: Secondary | ICD-10-CM

## 2024-04-03 DIAGNOSIS — G44209 Tension-type headache, unspecified, not intractable: Secondary | ICD-10-CM

## 2024-04-03 NOTE — Progress Notes (Signed)
 "  Subjective:    Patient ID: Suzanne Hart, female    DOB: 22-Aug-1965, 59 y.o.   MRN: [id number]  HPI Discussed the use of AI scribe software for clinical note transcription with the patient, who gave verbal consent to proceed.  History of Present Illness Suzanne Hart is a 59 year old female who presents with chronic headaches and medication management.  She has been experiencing chronic headaches for two to three years, characterized by severe pressure in the frontal sinus area, often waking her up in the middle of the night. These headaches occur at least four nights a week and are accompanied by nausea and dry eyes (xerophthalmia). The headaches sometimes cause nausea.  She manages her symptoms with saline, massages, ice packs, and over-the-counter medications like Tylenol and Advil , which she tries to limit to once or twice a week. The headaches typically improve after taking medication and drinking water , allowing her to return to sleep quickly.  She has a history of arthritis in her neck and has tried various pillows to alleviate her symptoms. She sleeps on her side and has found a pillow that supports her neck and arms, reducing numbness in her hands. She also uses a humidifier and takes showers at night to help with her symptoms.  She is currently taking Cymbalta  (duloxetine ) 20 mg daily in the morning, which she finds helpful. She previously took it at night but switched to mornings due to sleep disturbances. She also has a bottle of muscle relaxers from a previous prescription, which she uses occasionally when her neck muscles are particularly tight.  Her past medical history includes slight degenerative disc changes at C6-7, noted in an x-ray from about fourteen years ago. She mentions having had recent blood work done for DNA testing related to cardiac risk, but results are pending.  Review of Systems  Respiratory:  Negative for cough, chest tightness, shortness of breath and  wheezing.   Cardiovascular:  Negative for chest pain.  Neurological:  Positive for headaches. Negative for facial asymmetry, speech difficulty and weakness.  Psychiatric/Behavioral:  Positive for dysphoric mood. Negative for suicidal ideas. The patient is nervous/anxious.       04/03/2024    4:03 PM  Depression screen PHQ 2/9  Decreased Interest 1  Down, Depressed, Hopeless 0  PHQ - 2 Score 1  Altered sleeping 2  Tired, decreased energy 0  Change in appetite 1  Feeling bad or failure about yourself  0  Trouble concentrating 1  Moving slowly or fidgety/restless 0  Suicidal thoughts 0  PHQ-9 Score 5  Difficult doing work/chores Somewhat difficult      04/03/2024    4:03 PM 01/10/2024    8:22 AM 05/20/2023    2:36 PM 05/07/2023    1:17 PM  GAD 7 : Generalized Anxiety Score  Nervous, Anxious, on Edge 0 1 1 0  Control/stop worrying 0 1 1 0  Worry too much - different things 1 1 1  0  Trouble relaxing 1 1 1 1   Restless 1 0 1 0  Easily annoyed or irritable 0 1 1 0  Afraid - awful might happen 0 1 1 0  Total GAD 7 Score 3 6 7 1   Anxiety Difficulty Somewhat difficult Somewhat difficult Somewhat difficult     Social History[1]      Objective:   Physical Exam NAD.  Alert, oriented.  Calm cheerful affect.  Making good eye contact.  Speech clear.  Behavior normal.  Thoughts logical  coherent and relevant.  TMs minimal clear effusion, no erythema.  Pharynx clear and moist.  Neck supple with minimal anterior cervical adenopathy.  Lungs clear.  Heart regular rate rhythm.  Very tight tender muscles noted along the lateral and posterior neck area especially on the left side.  This extends into the trapezius and rhomboids bilaterally.  Normal ROM of the neck.  Today's Vitals   04/03/24 1551  BP: 128/86  Pulse: 65  Temp: 98.4 F (36.9 C)  SpO2: 98%  Weight: 207 lb 2 oz (94 kg)  Height: 5' 5 (1.651 m)   Body mass index is 34.47 kg/m.         Assessment & Plan:  1. Anxiety and  depression (Primary) Managed with duloxetine  20 mg daily, effective and well-tolerated. - Continue duloxetine  20 mg daily in the morning.  2. Muscle spasms of neck - Continue ibuprofen  and acetaminophen as needed. - Use ice, heat, and stretching for neck and upper back. - Utilize TENS unit before bedtime. - Consider massage therapy and chiropractic adjustments. - Keep muscle relaxants available for nighttime use if needed. - Monitor symptoms and report persistent headaches. - tiZANidine  (ZANAFLEX ) 4 MG capsule; Take 4 mg by mouth 3 (three) times daily.  3. Muscle contraction headache - Continue ibuprofen  and acetaminophen as needed. - Use ice, heat, and stretching for neck and upper back. - Utilize TENS unit before bedtime. - Consider massage therapy and chiropractic adjustments. - Keep muscle relaxants available for nighttime use if needed. - Monitor symptoms and report persistent headaches.  - tiZANidine  (ZANAFLEX ) 4 MG capsule; Take 4 mg by mouth 3 (three) times daily.  Patient has had a hysterectomy but explained that we still recommend regular preventive health physicals and mammograms.  Encourage patient to schedule an appointment.  Return in about 6 months (around 10/01/2024).         [1]  Social History Tobacco Use   Smoking status: Every Day    Current packs/day: 0.50    Average packs/day: 0.5 packs/day for 5.0 years (2.5 ttl pk-yrs)    Types: Cigarettes   Smokeless tobacco: Never  Vaping Use   Vaping status: Never Used  Substance Use Topics   Alcohol use: No    Alcohol/week: 0.0 standard drinks of alcohol   Drug use: No   "

## 2024-04-03 NOTE — Progress Notes (Signed)
" ° °  Subjective:    Patient ID: Suzanne Hart, female    DOB: February 04, 1966, 59 y.o.   MRN: [id number]  HPI    Review of Systems     Objective:   Physical Exam        Assessment & Plan:    "

## 2024-04-06 ENCOUNTER — Encounter: Payer: Self-pay | Admitting: Nurse Practitioner

## 2024-04-07 ENCOUNTER — Ambulatory Visit: Admitting: Nurse Practitioner

## 2024-04-10 LAB — GENECONNECT MOLECULAR SCREEN: Genetic Analysis Overall Interpretation: NEGATIVE

## 2024-10-01 ENCOUNTER — Ambulatory Visit: Admitting: Nurse Practitioner
